# Patient Record
Sex: Female | Born: 1995 | Race: White | Hispanic: No | Marital: Single | Smoking: Former smoker
Health system: Southern US, Community
[De-identification: ages and names within clinical notes are randomized; demographics above are authoritative.]

## PROBLEM LIST (undated history)

## (undated) DIAGNOSIS — F909 Attention-deficit hyperactivity disorder, unspecified type: Secondary | ICD-10-CM

## (undated) DIAGNOSIS — F32A Depression, unspecified: Secondary | ICD-10-CM

## (undated) DIAGNOSIS — J45909 Unspecified asthma, uncomplicated: Secondary | ICD-10-CM

## (undated) DIAGNOSIS — F329 Major depressive disorder, single episode, unspecified: Secondary | ICD-10-CM

## (undated) HISTORY — PX: WISDOM TOOTH EXTRACTION: SHX21

---
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
## 2017-04-16 ENCOUNTER — Encounter (HOSPITAL_COMMUNITY): Payer: Self-pay | Admitting: Emergency Medicine

## 2017-04-16 ENCOUNTER — Emergency Department (HOSPITAL_COMMUNITY)
Admission: EM | Admit: 2017-04-16 | Discharge: 2017-04-16 | Disposition: A | Discharge status: Home / Self Care | Payer: BLUE CROSS/BLUE SHIELD | Attending: Emergency Medicine | Admitting: Emergency Medicine

## 2017-04-16 ENCOUNTER — Emergency Department (HOSPITAL_COMMUNITY): Payer: BLUE CROSS/BLUE SHIELD

## 2017-04-16 DIAGNOSIS — M549 Dorsalgia, unspecified: Secondary | ICD-10-CM | POA: Diagnosis not present

## 2017-04-16 DIAGNOSIS — Y939 Activity, unspecified: Secondary | ICD-10-CM | POA: Diagnosis not present

## 2017-04-16 DIAGNOSIS — F17210 Nicotine dependence, cigarettes, uncomplicated: Secondary | ICD-10-CM | POA: Diagnosis not present

## 2017-04-16 DIAGNOSIS — R42 Dizziness and giddiness: Secondary | ICD-10-CM | POA: Insufficient documentation

## 2017-04-16 DIAGNOSIS — Y998 Other external cause status: Secondary | ICD-10-CM | POA: Diagnosis not present

## 2017-04-16 DIAGNOSIS — Y92410 Unspecified street and highway as the place of occurrence of the external cause: Secondary | ICD-10-CM | POA: Insufficient documentation

## 2017-04-16 DIAGNOSIS — R51 Headache: Secondary | ICD-10-CM | POA: Insufficient documentation

## 2017-04-16 DIAGNOSIS — S022XXA Fracture of nasal bones, initial encounter for closed fracture: Secondary | ICD-10-CM | POA: Insufficient documentation

## 2017-04-16 HISTORY — DX: Major depressive disorder, single episode, unspecified: F32.9

## 2017-04-16 HISTORY — DX: Attention-deficit hyperactivity disorder, unspecified type: F90.9

## 2017-04-16 HISTORY — DX: Depression, unspecified: F32.A

## 2017-04-16 LAB — POC URINE PREG, ED: Preg Test, Ur: NEGATIVE

## 2017-04-16 MED ORDER — IBUPROFEN 800 MG PO TABS: 800.0000 mg | ORAL_TABLET | Freq: Three times a day (TID) | ORAL | 0 refills | Status: AC

## 2017-04-16 MED ORDER — CYCLOBENZAPRINE HCL 10 MG PO TABS: 10.0000 mg | ORAL_TABLET | Freq: Two times a day (BID) | ORAL | 0 refills | Status: AC | PRN

## 2017-04-16 MED ORDER — ACETAMINOPHEN 500 MG PO TABS: 500.0000 mg | ORAL_TABLET | Freq: Four times a day (QID) | ORAL | 0 refills | Status: DC | PRN
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
## 2017-11-18 ENCOUNTER — Other Ambulatory Visit: Payer: Self-pay

## 2017-11-18 ENCOUNTER — Emergency Department (HOSPITAL_COMMUNITY)
Admission: EM | Admit: 2017-11-18 | Discharge: 2017-11-19 | Disposition: A | Discharge status: Home / Self Care | Payer: 59 | Attending: Emergency Medicine | Admitting: Emergency Medicine

## 2017-11-18 ENCOUNTER — Encounter (HOSPITAL_COMMUNITY): Payer: Self-pay | Admitting: Emergency Medicine

## 2017-11-18 DIAGNOSIS — R45851 Suicidal ideations: Secondary | ICD-10-CM | POA: Diagnosis not present

## 2017-11-18 DIAGNOSIS — F310 Bipolar disorder, current episode hypomanic: Secondary | ICD-10-CM | POA: Diagnosis present

## 2017-11-18 DIAGNOSIS — F3010 Manic episode without psychotic symptoms, unspecified: Secondary | ICD-10-CM

## 2017-11-18 DIAGNOSIS — J45909 Unspecified asthma, uncomplicated: Secondary | ICD-10-CM | POA: Diagnosis not present

## 2017-11-18 DIAGNOSIS — F1020 Alcohol dependence, uncomplicated: Secondary | ICD-10-CM | POA: Diagnosis present

## 2017-11-18 HISTORY — DX: Unspecified asthma, uncomplicated: J45.909

## 2017-11-18 LAB — CBC
HCT: 40.7 % (ref 36.0–46.0)
Hemoglobin: 14.2 g/dL (ref 12.0–15.0)
MCH: 31.7 pg (ref 26.0–34.0)
MCHC: 34.9 g/dL (ref 30.0–36.0)
MCV: 90.8 fL (ref 78.0–100.0)
PLATELETS: 199 10*3/uL (ref 150–400)
RBC: 4.48 MIL/uL (ref 3.87–5.11)
RDW: 12.4 % (ref 11.5–15.5)
WBC: 6.4 10*3/uL (ref 4.0–10.5)

## 2017-11-18 LAB — COMPREHENSIVE METABOLIC PANEL
ALT: 18 U/L (ref 14–54)
AST: 26 U/L (ref 15–41)
Albumin: 4.5 g/dL (ref 3.5–5.0)
Alkaline Phosphatase: 57 U/L (ref 38–126)
Anion gap: 11 (ref 5–15)
BILIRUBIN TOTAL: 0.7 mg/dL (ref 0.3–1.2)
BUN: 7 mg/dL (ref 6–20)
CO2: 23 mmol/L (ref 22–32)
CREATININE: 0.54 mg/dL (ref 0.44–1.00)
Calcium: 8.8 mg/dL — ABNORMAL LOW (ref 8.9–10.3)
Chloride: 108 mmol/L (ref 101–111)
GFR calc Af Amer: >60 mL/min (ref >60)
Glucose, Bld: 96 mg/dL (ref 65–99)
POTASSIUM: 3.3 mmol/L — AB (ref 3.5–5.1)
Sodium: 142 mmol/L (ref 135–145)
TOTAL PROTEIN: 7.3 g/dL (ref 6.5–8.1)

## 2017-11-18 LAB — ACETAMINOPHEN LEVEL: Acetaminophen (Tylenol), Serum: <10 ug/mL — ABNORMAL LOW (ref 10–30)

## 2017-11-18 LAB — ETHANOL: ALCOHOL ETHYL (B): 136 mg/dL — AB (ref <10)

## 2017-11-18 LAB — I-STAT BETA HCG BLOOD, ED (MC, WL, AP ONLY)

## 2017-11-18 LAB — SALICYLATE LEVEL

## 2017-11-18 MED ORDER — ACETAMINOPHEN 325 MG PO TABS
650.0000 mg | ORAL_TABLET | ORAL | Status: DC | PRN
  Administered 2017-11-18 – 2017-11-19 (×2): 650 mg via ORAL
  Filled 2017-11-18 (×2): qty 2

## 2017-11-18 MED ORDER — BENZTROPINE MESYLATE 1 MG PO TABS: 1.0000 mg | ORAL_TABLET | Freq: Four times a day (QID) | ORAL | Status: DC | PRN

## 2017-11-18 MED ORDER — ARIPIPRAZOLE 2 MG PO TABS
2.0000 mg | ORAL_TABLET | Freq: Every day | ORAL | Status: DC
Start: 2017-11-18 — End: 2017-11-19
  Administered 2017-11-18 – 2017-11-19 (×2): 2 mg via ORAL
  Filled 2017-11-18 (×2): qty 1

## 2017-11-18 MED ORDER — GUAIFENESIN ER 600 MG PO TB12
600.0000 mg | ORAL_TABLET | Freq: Two times a day (BID) | ORAL | Status: DC | PRN
  Filled 2017-11-18 (×2): qty 1

## 2017-11-18 MED ORDER — GABAPENTIN 100 MG PO CAPS
200.0000 mg | ORAL_CAPSULE | Freq: Two times a day (BID) | ORAL | Status: DC
  Administered 2017-11-18 – 2017-11-19 (×3): 200 mg via ORAL
  Filled 2017-11-18 (×4): qty 2

## 2017-11-18 MED ORDER — HYDROXYZINE HCL 25 MG PO TABS
25.0000 mg | ORAL_TABLET | Freq: Four times a day (QID) | ORAL | Status: DC | PRN
  Administered 2017-11-18 (×2): 25 mg via ORAL
  Filled 2017-11-18 (×2): qty 1

## 2017-11-18 MED ORDER — POTASSIUM CHLORIDE CRYS ER 20 MEQ PO TBCR
40.0000 meq | EXTENDED_RELEASE_TABLET | Freq: Once | ORAL | Status: AC
  Administered 2017-11-18: 40 meq via ORAL
  Filled 2017-11-18: qty 2

## 2017-11-18 MED ORDER — HALOPERIDOL 5 MG PO TABS: 5.0000 mg | ORAL_TABLET | Freq: Four times a day (QID) | ORAL | Status: DC | PRN
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
## 2017-11-18 NOTE — ED Notes (Signed)
Pt unable to provide urine sample at this time 

## 2017-11-18 NOTE — ED Notes (Signed)
ED Provider at bedside. 

## 2017-11-18 NOTE — ED Notes (Signed)
TTS at the bedside to speak with patient.
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
## 2017-11-18 NOTE — ED Notes (Signed)
EDP called about pt's concern of nasal congestion and possible sinus infection.

## 2017-11-18 NOTE — ED Notes (Signed)
SBAR Report received from previous nurse. Pt received calm and visible on unit. Pt resting now but denies current SI/ HI, A/V H, depression, anxiety, or pain at this time, and appears otherwise stable and free of distress. Pt reminded of camera surveillance, q 15 min rounds, and rules of the milieu. Will continue to assess.
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
## 2017-11-18 NOTE — ED Notes (Signed)
Bed: W[facility]39 Expected date:  Expected time:  Means of arrival:  Comments: Hold for hall C 

## 2017-11-19 ENCOUNTER — Encounter (HOSPITAL_COMMUNITY): Payer: Self-pay | Admitting: Emergency Medicine

## 2017-11-19 ENCOUNTER — Emergency Department (HOSPITAL_COMMUNITY): Payer: 59

## 2017-11-19 DIAGNOSIS — F310 Bipolar disorder, current episode hypomanic: Secondary | ICD-10-CM | POA: Diagnosis not present

## 2017-11-19 LAB — URINALYSIS, ROUTINE W REFLEX MICROSCOPIC
BACTERIA UA: NONE SEEN
BACTERIA UA: NONE SEEN
BILIRUBIN URINE: NEGATIVE
BILIRUBIN URINE: NEGATIVE
Glucose, UA: NEGATIVE mg/dL
Glucose, UA: NEGATIVE mg/dL
Hgb urine dipstick: NEGATIVE
KETONES UR: 5 mg/dL — AB
Ketones, ur: 5 mg/dL — AB
Leukocytes, UA: NEGATIVE
NITRITE: NEGATIVE
Nitrite: NEGATIVE
PH: 6 (ref 5.0–8.0)
PH: 7 (ref 5.0–8.0)
Protein, ur: 30 mg/dL — AB
Protein, ur: NEGATIVE mg/dL
SPECIFIC GRAVITY, URINE: 1.003 — AB (ref 1.005–1.030)
Specific Gravity, Urine: 1.026 (ref 1.005–1.030)

## 2017-11-19 LAB — RAPID URINE DRUG SCREEN, HOSP PERFORMED
Amphetamines: NOT DETECTED
Barbiturates: NOT DETECTED
Benzodiazepines: NOT DETECTED
COCAINE: NOT DETECTED
OPIATES: NOT DETECTED
Tetrahydrocannabinol: POSITIVE — AB

## 2017-11-19 NOTE — ED Notes (Signed)
Pt taking a shower 

## 2017-11-19 NOTE — ED Notes (Signed)
EDP Steinl notified of pt elevated temp and pulse. Also notified of pt resulted UA
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
## 2017-11-19 NOTE — Discharge Instructions (Signed)
For your behavioral health needs, you are advised to continue treatment with [facility] [place]:       [facility]      [address]       [place], [place] [address]      [phone]

## 2017-11-19 NOTE — ED Notes (Signed)
Pt d/c home per MD order. Discharge summary reviewed with pt. Pt verbalizes understanding. Pt denies SI/HI/AVH. Personal property returned to pt. Pt signed e-signature. Ambulatory off unit. Pt mother in lobby for discharge.
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
## 2017-11-19 NOTE — [facility] Suicide Risk Assessment (Signed)
Suicide Risk Assessment  Discharge Assessment   [facility] Discharge Suicide Risk Assessment   Principal Problem: Bipolar I disorder, current or most recent episode hypomanic [facility]) Discharge Diagnoses:  Patient Active Problem List   Diagnosis Date Noted  . Bipolar I disorder, current or most recent episode hypomanic ([facility]) [F31.0] 11/18/2017  . Moderate alcohol use disorder ([facility]) [F10.20] 11/18/2017    Total Time spent with patient: 45 minutes  Musculoskeletal: Strength & Muscle Tone: within normal limits Gait & Station: normal Patient leans: N/A  Psychiatric Specialty Exam: Physical Exam ROS Blood pressure 109/67, pulse (!) 110, temperature 100.2 F (37.9 C), temperature source Oral, resp. rate 16, last menstrual period 10/23/2017, SpO2 97 %.There is no height or weight on file to calculate BMI. General Appearance: Casual Eye Contact:  Good Speech:  Clear and Coherent and Normal Rate Volume:  Normal Mood:  Anxious Affect:  Congruent Thought Process:  Coherent, Goal Directed and Linear Orientation:  Full (Time, Place, and Person) Thought Content:  Logical Suicidal Thoughts:  No Homicidal Thoughts:  No Memory:  Immediate;   Good Recent;   Good Remote;   Fair Judgement:  Poor Insight:  Lacking Psychomotor Activity:  Normal Concentration:  Concentration: Good and Attention Span: Good Recall:  Good Fund of Knowledge:  Good Language:  Good Akathisia:  No Handed:  Right AIMS (if indicated):    Assets:  Architect Housing ADL's:  Intact Cognition:  WNL   Mental Status Per Nursing Assessment::   On Admission:   agitated and manic  Demographic Factors:  Adolescent or young adult and Caucasian  Loss Factors: Financial problems/change in socioeconomic status  Historical Factors: Impulsivity  Risk Reduction Factors:   Sense of responsibility to family and Living with another person, especially a relative  Continued Clinical  Symptoms:  Severe Anxiety and/or Agitation Bipolar Disorder:   Mixed State Depression:   Aggression Comorbid alcohol abuse/dependence Impulsivity Alcohol/Substance Abuse/Dependencies  Cognitive Features That Contribute To Risk:  Closed-mindedness    Suicide Risk:  Minimal: No identifiable suicidal ideation.  Patients presenting with no risk factors but with morbid ruminations; may be classified as minimal risk based on the severity of the depressive symptoms    Plan Of Care/Follow-up recommendations:  Activity:  as tolerated Diet:  Heart Healthy  Laveda Abbe, NP 11/19/2017, 12:42 PM

## 2017-11-19 NOTE — ED Notes (Signed)
Specimen cup provided

## 2019-03-18 IMAGING — CR DG CHEST 2V
2 series · 2 of 2 positions shown · non-contrast
Comparison: None in PACs

CLINICAL DATA: Fever, altered mental status. History of asthma,
alcohol use disorder.

EXAM:
CHEST - 2 VIEW

[w chest pa]
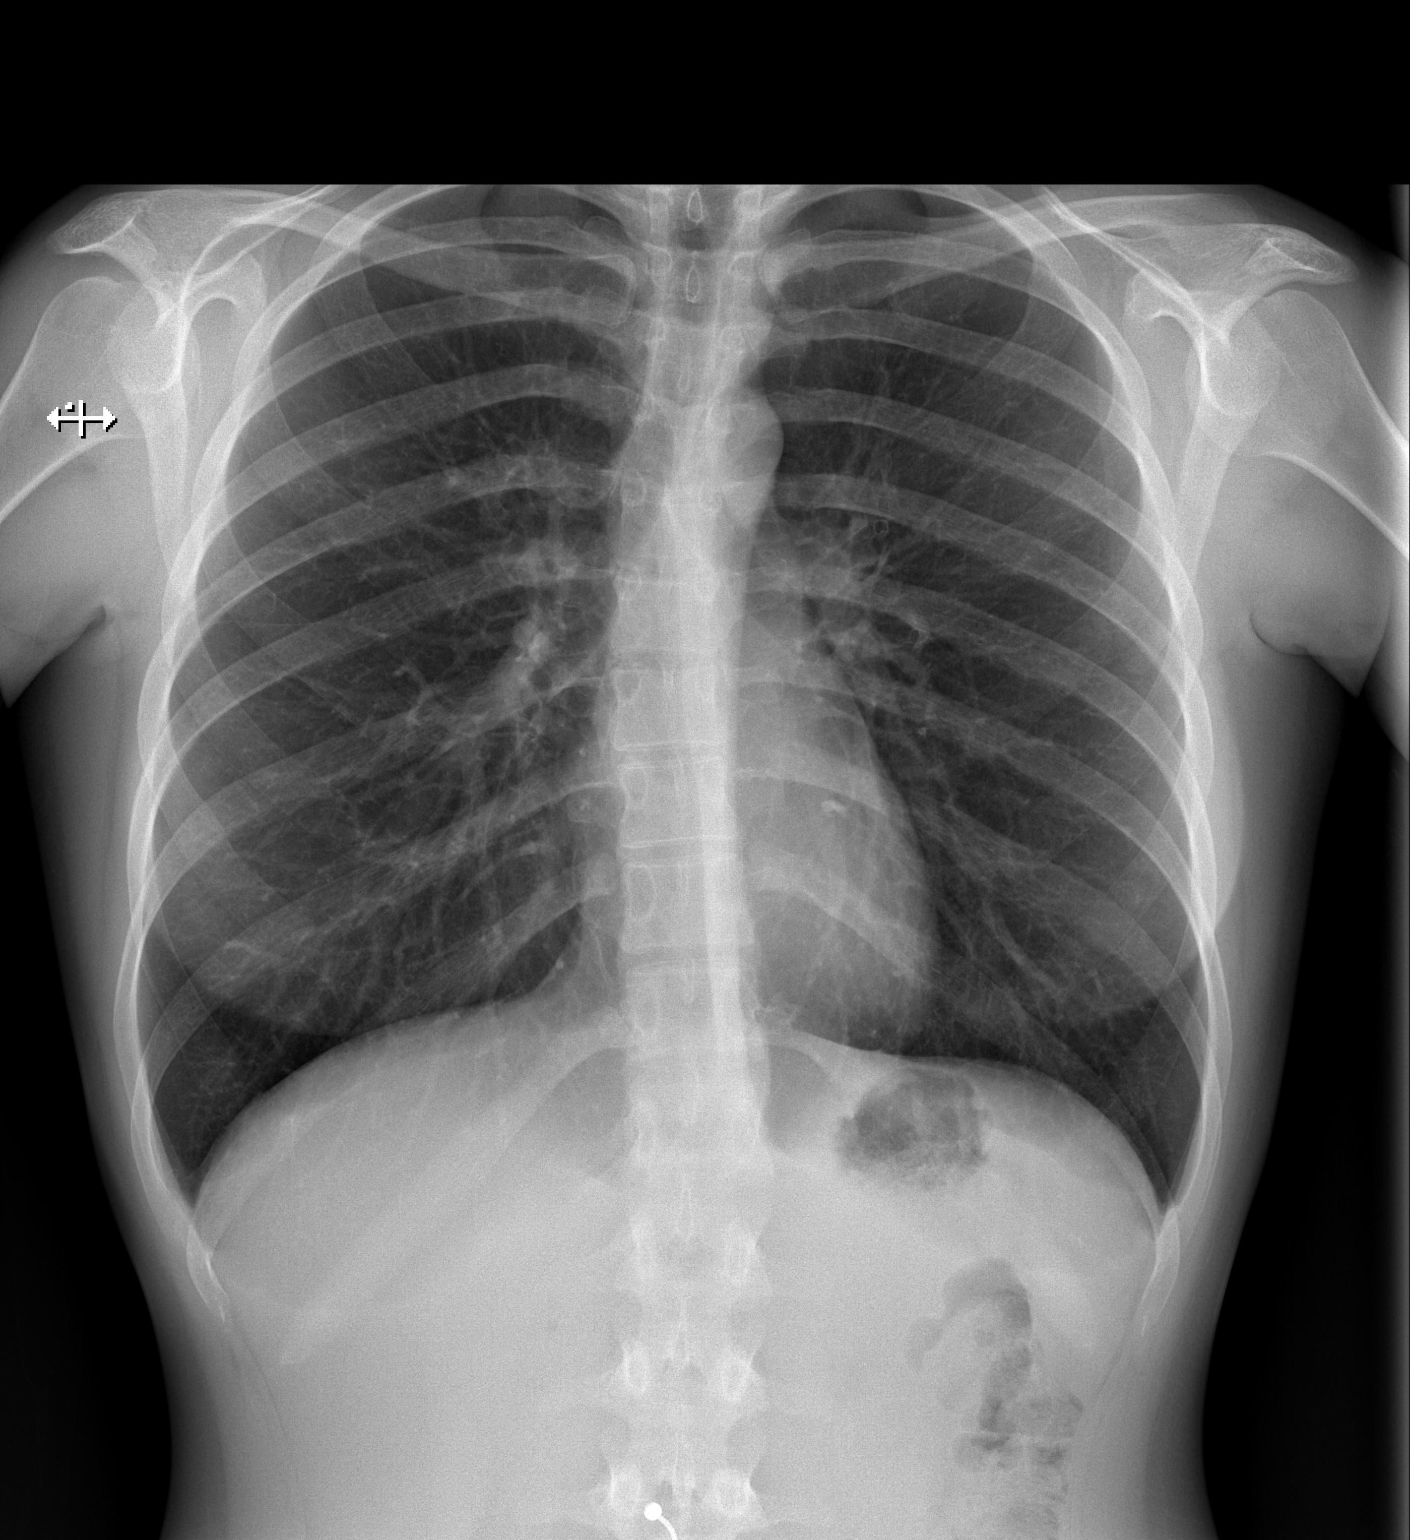

[w chest lat]
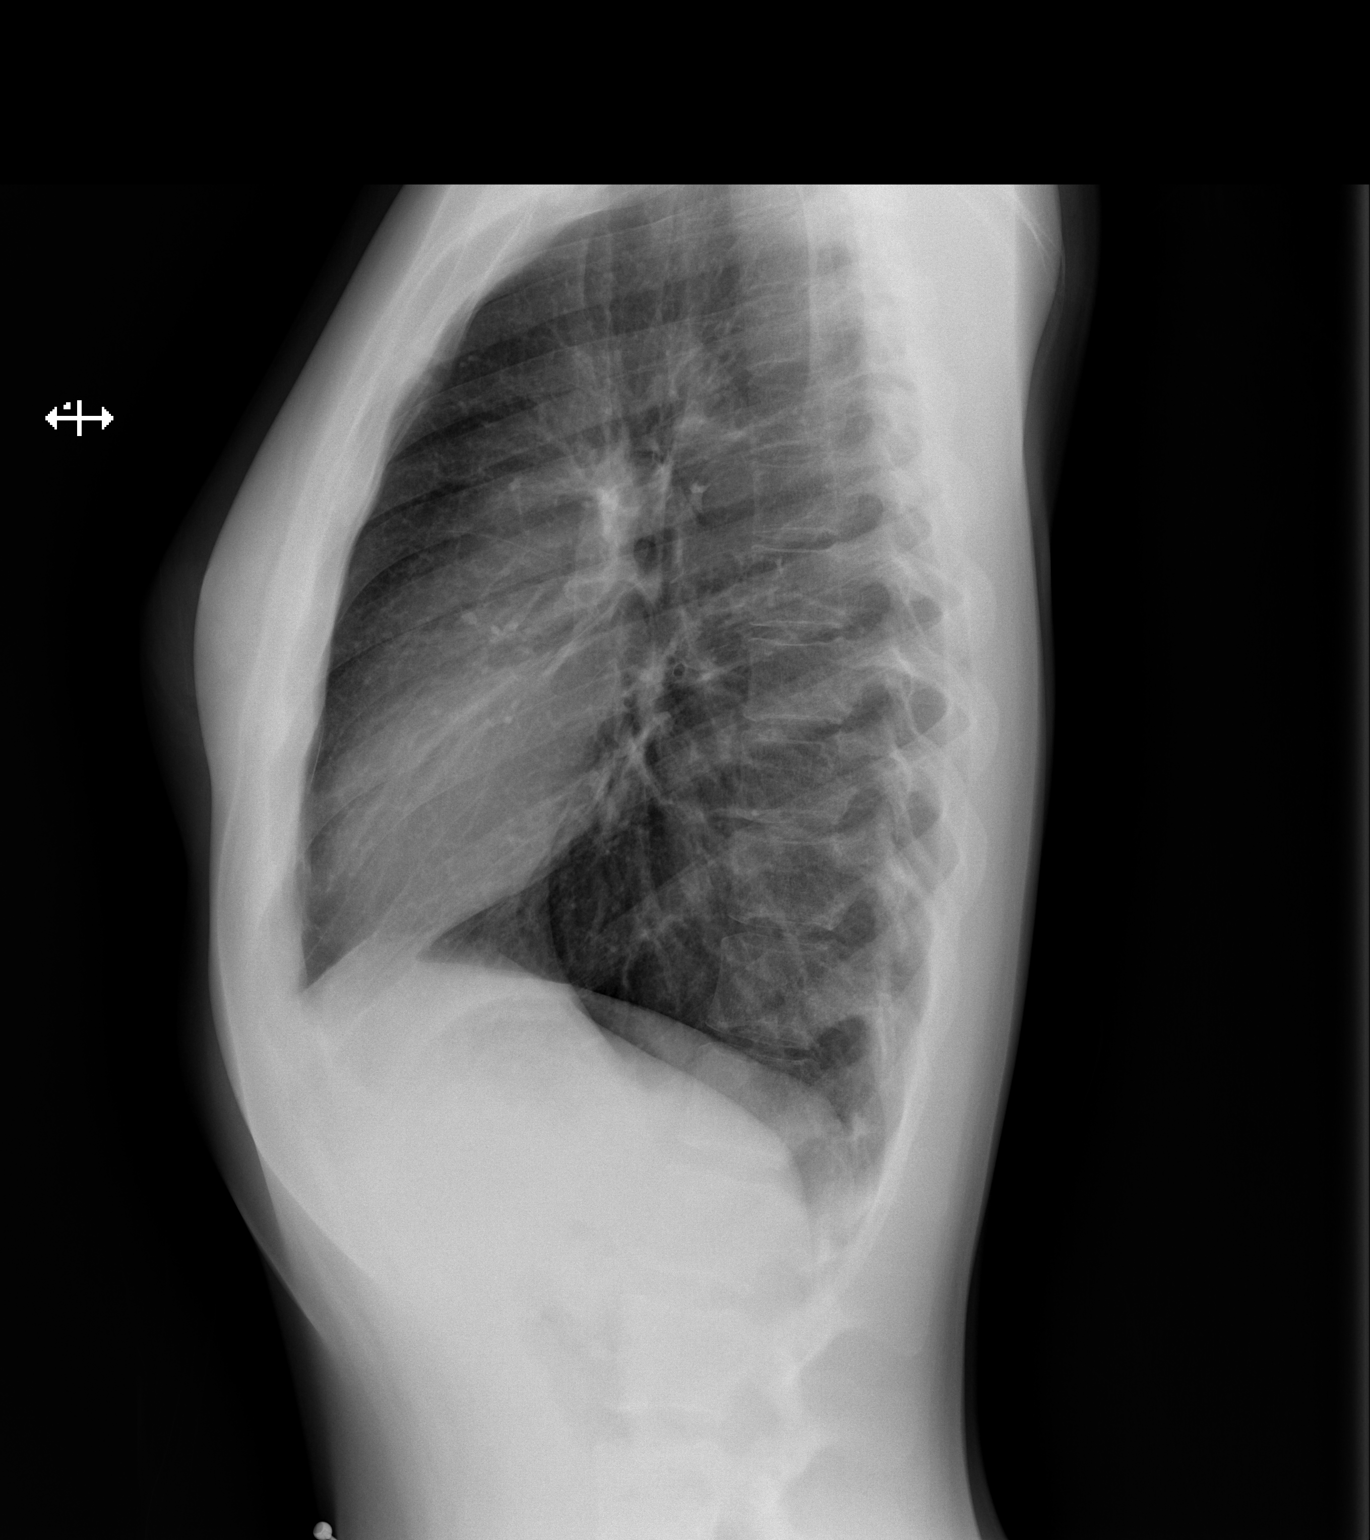

[2 of 2 positions shown; findings below may reference images not displayed]

FINDINGS: The lungs are mildly hyperinflated. There is no alveolar infiltrate
or pleural effusion. There is no pneumothorax. The heart and
pulmonary vascularity are normal. The mediastinum is normal in
width. There is gentle dextrocurvature centered in the midthoracic
spine. The bony thorax is unremarkable.
IMPRESSION: Mild hyperinflation consistent with known reactive airway disease.
No pneumonia nor other acute cardiopulmonary abnormality.
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
## 2020-10-24 ENCOUNTER — Emergency Department (HOSPITAL_BASED_OUTPATIENT_CLINIC_OR_DEPARTMENT_OTHER)
Admission: EM | Admit: 2020-10-24 | Discharge: 2020-10-24 | Disposition: A | Discharge status: Home / Self Care | Payer: No Typology Code available for payment source | Attending: Emergency Medicine | Admitting: Emergency Medicine

## 2020-10-24 ENCOUNTER — Other Ambulatory Visit: Payer: Self-pay

## 2020-10-24 ENCOUNTER — Encounter (HOSPITAL_BASED_OUTPATIENT_CLINIC_OR_DEPARTMENT_OTHER): Payer: Self-pay | Admitting: Emergency Medicine

## 2020-10-24 DIAGNOSIS — F17210 Nicotine dependence, cigarettes, uncomplicated: Secondary | ICD-10-CM | POA: Insufficient documentation

## 2020-10-24 DIAGNOSIS — O219 Vomiting of pregnancy, unspecified: Secondary | ICD-10-CM | POA: Diagnosis present

## 2020-10-24 DIAGNOSIS — E876 Hypokalemia: Secondary | ICD-10-CM | POA: Insufficient documentation

## 2020-10-24 DIAGNOSIS — Z3A01 Less than 8 weeks gestation of pregnancy: Secondary | ICD-10-CM | POA: Insufficient documentation

## 2020-10-24 DIAGNOSIS — R000 Tachycardia, unspecified: Secondary | ICD-10-CM | POA: Diagnosis not present

## 2020-10-24 DIAGNOSIS — O210 Mild hyperemesis gravidarum: Secondary | ICD-10-CM | POA: Insufficient documentation

## 2020-10-24 DIAGNOSIS — J45909 Unspecified asthma, uncomplicated: Secondary | ICD-10-CM | POA: Diagnosis not present

## 2020-10-24 LAB — COMPREHENSIVE METABOLIC PANEL
ALT: 13 U/L (ref 0–44)
AST: 18 U/L (ref 15–41)
Albumin: 4.6 g/dL (ref 3.5–5.0)
Alkaline Phosphatase: 45 U/L (ref 38–126)
Anion gap: 11 (ref 5–15)
BUN: 10 mg/dL (ref 6–20)
CO2: 21 mmol/L — ABNORMAL LOW (ref 22–32)
Calcium: 9.4 mg/dL (ref 8.9–10.3)
Chloride: 102 mmol/L (ref 98–111)
Creatinine, Ser: 0.54 mg/dL (ref 0.44–1.00)
GFR, Estimated: >60 mL/min (ref >60)
Glucose, Bld: 94 mg/dL (ref 70–99)
Potassium: 3.1 mmol/L — ABNORMAL LOW (ref 3.5–5.1)
Sodium: 134 mmol/L — ABNORMAL LOW (ref 135–145)
Total Bilirubin: 1.8 mg/dL — ABNORMAL HIGH (ref 0.3–1.2)
Total Protein: 7.8 g/dL (ref 6.5–8.1)

## 2020-10-24 LAB — CBC WITH DIFFERENTIAL/PLATELET
Abs Immature Granulocytes: 0.04 10*3/uL (ref 0.00–0.07)
Basophils Absolute: 0 10*3/uL (ref 0.0–0.1)
Basophils Relative: 0 %
Eosinophils Absolute: 0 10*3/uL (ref 0.0–0.5)
Eosinophils Relative: 0 %
HCT: 42.9 % (ref 36.0–46.0)
Hemoglobin: 15.5 g/dL — ABNORMAL HIGH (ref 12.0–15.0)
Immature Granulocytes: 0 %
Lymphocytes Relative: 15 %
Lymphs Abs: 1.4 10*3/uL (ref 0.7–4.0)
MCH: 32.8 pg (ref 26.0–34.0)
MCHC: 36.1 g/dL — ABNORMAL HIGH (ref 30.0–36.0)
MCV: 90.7 fL (ref 80.0–100.0)
Monocytes Absolute: 0.7 10*3/uL (ref 0.1–1.0)
Monocytes Relative: 8 %
Neutro Abs: 7.1 10*3/uL (ref 1.7–7.7)
Neutrophils Relative %: 77 %
Platelets: 249 10*3/uL (ref 150–400)
RBC: 4.73 MIL/uL (ref 3.87–5.11)
RDW: 11.9 % (ref 11.5–15.5)
WBC: 9.3 10*3/uL (ref 4.0–10.5)
nRBC: 0 % (ref 0.0–0.2)

## 2020-10-24 LAB — URINALYSIS, ROUTINE W REFLEX MICROSCOPIC
Bilirubin Urine: NEGATIVE
Glucose, UA: NEGATIVE mg/dL
Hgb urine dipstick: NEGATIVE
Ketones, ur: 15 mg/dL — AB
Nitrite: NEGATIVE
Protein, ur: NEGATIVE mg/dL
Specific Gravity, Urine: 1.005 (ref 1.005–1.030)
pH: 5 (ref 5.0–8.0)

## 2020-10-24 LAB — URINALYSIS, MICROSCOPIC (REFLEX): RBC / HPF: NONE SEEN RBC/hpf (ref 0–5)

## 2020-10-24 LAB — PREGNANCY, URINE: Preg Test, Ur: POSITIVE — AB

## 2020-10-24 LAB — HCG, QUANTITATIVE, PREGNANCY: hCG, Beta Chain, Quant, S: 61217 m[IU]/mL — ABNORMAL HIGH (ref <5)

## 2020-10-24 MED ORDER — METOCLOPRAMIDE HCL 10 MG PO TABS: 10.0000 mg | ORAL_TABLET | Freq: Three times a day (TID) | ORAL | 0 refills | Status: DC | PRN

## 2020-10-24 MED ORDER — METOCLOPRAMIDE HCL 5 MG/ML IJ SOLN
10.0000 mg | Freq: Once | INTRAMUSCULAR | Status: AC
  Administered 2020-10-24: 10 mg via INTRAVENOUS
  Filled 2020-10-24: qty 2

## 2020-10-24 MED ORDER — SODIUM CHLORIDE 0.9 % IV BOLUS
1000.0000 mL | Freq: Once | INTRAVENOUS | Status: AC
  Administered 2020-10-24: 1000 mL via INTRAVENOUS

## 2020-10-24 MED ORDER — METOCLOPRAMIDE HCL 10 MG PO TABS: 10.0000 mg | ORAL_TABLET | Freq: Three times a day (TID) | ORAL | 0 refills | Status: AC | PRN

## 2020-10-24 MED ORDER — DOXYLAMINE-PYRIDOXINE 10-10 MG PO TBEC: 2.0000 | DELAYED_RELEASE_TABLET | Freq: Every evening | ORAL | 1 refills | Status: AC | PRN

## 2020-10-24 MED ORDER — PROMETHAZINE HCL 25 MG/ML IJ SOLN
25.0000 mg | Freq: Once | INTRAMUSCULAR | Status: AC
  Administered 2020-10-24: 25 mg via INTRAVENOUS
  Filled 2020-10-24: qty 1

## 2020-10-24 MED ORDER — DOXYLAMINE-PYRIDOXINE 10-10 MG PO TBEC: 2.0000 | DELAYED_RELEASE_TABLET | Freq: Every evening | ORAL | 1 refills | Status: DC | PRN

## 2020-10-24 NOTE — ED Provider Notes (Signed)
[facility] EMERGENCY DEPARTMENT Provider Note   CSN: [id number] Arrival date & time: 10/24/20  0263     History Chief Complaint  Patient presents with  . Emesis During Pregnancy    Deborah Morgan is a 25 y.o. female.  She is approximately [redacted] weeks pregnant by dates.  Has not had any ultrasound confirmation.  Complaining of 4 days of vomiting.  Last few episodes have been associated with a little bit of blood in the emesis.  Burning substernal chest pain.  No abdominal pain.  No fevers chills.  No diarrhea urinary symptoms vaginal discharge or bleeding.  Has not tried anything for it.  The history is provided by the patient.  Emesis Severity:  Moderate Duration:  4 days Timing:  Intermittent Quality:  Bilious material and stomach contents Progression:  Unchanged Chronicity:  New Recent urination:  Decreased Relieved by:  None tried Worsened by:  Nothing Ineffective treatments:  None tried Associated symptoms: no abdominal pain, no cough, no diarrhea, no fever, no headaches, no myalgias and no sore throat   Risk factors: pregnant        Past Medical History:  Diagnosis Date  . ADHD   . Asthma   . Depression     Patient Active Problem List   Diagnosis Date Noted  . Bipolar I disorder, current or most recent episode hypomanic (HCC) 11/18/2017  . Moderate alcohol use disorder (HCC) 11/18/2017    Past Surgical History:  Procedure Laterality Date  . WISDOM TOOTH EXTRACTION       OB History    Gravida  1   Para  0   Term  0   Preterm  0   AB  0   Living        SAB  0   IAB  0   Ectopic  0   Multiple      Live Births              No family history on file.  Social History   Tobacco Use  . Smoking status: Current Every Day Smoker    Packs/day: 0.50    Years: 5.00    Pack years: 2.50    Types: Cigarettes  . Smokeless tobacco: Never Used  Vaping Use  . Vaping Use: Some days  Substance Use Topics  . Alcohol use: Yes     Comment: Pt endorsed use of 4 loco on 11/17/17  . Drug use: Yes    Types: Marijuana    Home Medications Prior to Admission medications   Medication Sig Start Date End Date Taking? Authorizing Provider  doxylamine, Sleep, (UNISOM) 25 MG tablet Take 25 mg by mouth at bedtime as needed.   Yes [provider]  acetaminophen (TYLENOL) 500 MG tablet Take 1 tablet (500 mg total) by mouth every 6 (six) hours as needed. 04/16/17   Law, Waylan Boga, PA-C  albuterol (VENTOLIN HFA) 108 (90 Base) MCG/ACT inhaler Inhale 2 puffs into the lungs every 6 (six) hours as needed. 05/29/20   [provider]  cyclobenzaprine (FLEXERIL) 10 MG tablet Take 1 tablet (10 mg total) by mouth 2 (two) times daily as needed for muscle spasms. 04/16/17   Law, Waylan Boga, PA-C  hydrOXYzine (ATARAX/VISTARIL) 10 MG tablet Take by mouth.    [provider]  ibuprofen (ADVIL,MOTRIN) 200 MG tablet Take 400 mg by mouth every 6 (six) hours as needed for mild pain.    [provider]  ibuprofen (ADVIL,MOTRIN) 800  MG tablet Take 1 tablet (800 mg total) by mouth 3 (three) times daily. 04/16/17   Law, Waylan Boga, PA-C  lamoTRIgine (LAMICTAL) 25 MG tablet Take 50 mg by mouth daily.    [provider]  Melatonin 10 MG TABS Take by mouth.    [provider]  sertraline (ZOLOFT) 50 MG tablet Take 50 mg by mouth daily.    [provider]    Allergies    Hydrocodone and Codeine  Review of Systems   Review of Systems  Constitutional: Negative for fever.  HENT: Negative for sore throat.   Eyes: Negative for visual disturbance.  Respiratory: Negative for cough and shortness of breath.   Cardiovascular: Positive for chest pain.  Gastrointestinal: Positive for vomiting. Negative for abdominal pain and diarrhea.  Genitourinary: Negative for dysuria.  Musculoskeletal: Negative for myalgias.  Skin: Negative for rash.  Neurological: Negative for headaches.    Physical  Exam Updated Vital Signs BP 119/74 (BP Location: Right Arm)   Pulse (!) 116   Temp 98.8 F (37.1 C) (Oral)   Resp 20   Ht 5\' 2"  (1.575 m)   Wt 60.8 kg   LMP 09/08/2020 (Exact Date)   SpO2 100%   BMI 24.51 kg/m   Physical Exam Vitals and nursing note reviewed.  Constitutional:      General: She is not in acute distress.    Appearance: Normal appearance. She is well-developed and well-nourished.  HENT:     Head: Normocephalic and atraumatic.  Eyes:     Conjunctiva/sclera: Conjunctivae normal.  Cardiovascular:     Rate and Rhythm: Regular rhythm. Tachycardia present.     Heart sounds: No murmur heard.   Pulmonary:     Effort: Pulmonary effort is normal. No respiratory distress.     Breath sounds: Normal breath sounds.  Abdominal:     Palpations: Abdomen is soft.     Tenderness: There is no abdominal tenderness.  Musculoskeletal:        General: No edema.     Cervical back: Neck supple.  Skin:    General: Skin is warm and dry.     Capillary Refill: Capillary refill takes less than 2 seconds.  Neurological:     General: No focal deficit present.     Mental Status: She is alert.  Psychiatric:        Mood and Affect: Mood and affect normal.     ED Results / Procedures / Treatments   Labs (all labs ordered are listed, but only abnormal results are displayed) Labs Reviewed  URINALYSIS, ROUTINE W REFLEX MICROSCOPIC - Abnormal; Notable for the following components:      Result Value   APPearance CLOUDY (*)    Ketones, ur 15 (*)    Leukocytes,Ua MODERATE (*)    All other components within normal limits  PREGNANCY, URINE - Abnormal; Notable for the following components:   Preg Test, Ur POSITIVE (*)    All other components within normal limits  COMPREHENSIVE METABOLIC PANEL - Abnormal; Notable for the following components:   Sodium 134 (*)    Potassium 3.1 (*)    CO2 21 (*)    Total Bilirubin 1.8 (*)    All other components within normal limits  CBC WITH  DIFFERENTIAL/PLATELET - Abnormal; Notable for the following components:   Hemoglobin 15.5 (*)    MCHC 36.1 (*)    All other components within normal limits  HCG, QUANTITATIVE, PREGNANCY - Abnormal; Notable for the following components:  hCG, Beta Chain, Quant, S 61,217 (*)    All other components within normal limits  URINALYSIS, MICROSCOPIC (REFLEX) - Abnormal; Notable for the following components:   Bacteria, UA FEW (*)    All other components within normal limits    EKG None  Radiology No results found.  Procedures Procedures   Medications Ordered in ED Medications  sodium chloride 0.9 % bolus 1,000 mL (0 mLs Intravenous Stopped 10/24/20 0903)  promethazine (PHENERGAN) injection 25 mg (25 mg Intravenous Given 10/24/20 0753)  sodium chloride 0.9 % bolus 1,000 mL (0 mLs Intravenous Stopped 10/24/20 1053)  metoCLOPramide (REGLAN) injection 10 mg (10 mg Intravenous Given 10/24/20 0919)    ED Course  I have reviewed the triage vital signs and the nursing notes.  Pertinent labs & imaging results that were available during my care of the patient were reviewed by me and considered in my medical decision making (see chart for details).  Clinical Course as of 10/24/20 1721  Sun Oct 24, 2020  1022 Reassessed patient, she vomited once after the Compazine and is doing better after the Reglan.  Has tolerated some sips.  Lab work shows mildly low potassium but otherwise fairly unremarkable.  She currently is not on any treatment for morning sickness. [MB]    Clinical Course User Index [MB] Terrilee Files, MD   MDM Rules/Calculators/A&P                         This patient complains of nausea and vomiting, some blood in the vomitus, early pregnancy; this involves an extensive number of treatment Options and is a complaint that carries with it a high risk of complications and Morbidity. The differential includes hyperemesis, metabolic derangement, Mallory-Weiss, gastritis, peptic ulcer  disease  I ordered, reviewed and interpreted labs, which included CBC with normal white count normal hemoglobin, chemistries with mildly low sodium and low bicarb reflecting some dehydration, urinalysis not convincingly infected, beta-hCG appropriate for dates I ordered medication IV fluids Compazine Reglan Additional history obtained from patient significant other and mother Previous records obtained and reviewed in epic, no recent admissions  After the interventions stated above, I reevaluated the patient and found patient's tachycardia to be resolved and is tolerating p.o.  She is comfortable plan for discharge with antiemetics.  Recommended close follow-up with OB/GYN.  Return instructions discussed   Final Clinical Impression(s) / ED Diagnoses Final diagnoses:  Hyperemesis gravidarum  Hypokalemia    Rx / DC Orders ED Discharge Orders         Ordered    Doxylamine-Pyridoxine 10-10 MG TBEC  At bedtime PRN,   Status:  Discontinued        10/24/20 1035    metoCLOPramide (REGLAN) 10 MG tablet  Every 8 hours PRN,   Status:  Discontinued        10/24/20 1035    Doxylamine-Pyridoxine 10-10 MG TBEC  At bedtime PRN        10/24/20 1118    metoCLOPramide (REGLAN) 10 MG tablet  Every 8 hours PRN        10/24/20 1118           Terrilee Files, MD 10/24/20 1723

## 2020-10-24 NOTE — ED Notes (Signed)
Pt ambulatory to bathroom

## 2020-10-24 NOTE — ED Notes (Signed)
Pt reports continues to have nausea and vomited about 5 minutes prior to this nurse coming in the room.

## 2020-10-24 NOTE — ED Notes (Signed)
Pt reports nausea decreased.

## 2020-10-24 NOTE — Discharge Instructions (Addendum)
You were seen in the emergency department for nausea and vomiting during early pregnancy.  Your lab work showed your potassium to be mildly low but otherwise was unremarkable.  We are prescribing you some medication that may help your symptoms.  Please do not take your Unisom/doxylamine while you are using this medication.  Contact your OB tomorrow and let them know about the medications you are on.  Return to the emergency department if any worsening or concerning symptoms

## 2020-10-24 NOTE — ED Triage Notes (Signed)
LMP 1/5, reports vomiting for four days. Reports "feeling dehydrated." States some blood in her vomit last night.

## 2020-11-23 DIAGNOSIS — O3680X0 Pregnancy with inconclusive fetal viability, not applicable or unspecified: Secondary | ICD-10-CM | POA: Diagnosis not present

## 2020-11-23 DIAGNOSIS — O0991 Supervision of high risk pregnancy, unspecified, first trimester: Secondary | ICD-10-CM | POA: Diagnosis not present

## 2021-02-01 DIAGNOSIS — O99321 Drug use complicating pregnancy, first trimester: Secondary | ICD-10-CM | POA: Diagnosis not present

## 2021-02-01 DIAGNOSIS — O0991 Supervision of high risk pregnancy, unspecified, first trimester: Secondary | ICD-10-CM | POA: Diagnosis not present

## 2021-02-01 DIAGNOSIS — O0992 Supervision of high risk pregnancy, unspecified, second trimester: Secondary | ICD-10-CM | POA: Diagnosis not present

## 2021-03-03 DIAGNOSIS — Z3689 Encounter for other specified antenatal screening: Secondary | ICD-10-CM | POA: Diagnosis not present

## 2021-03-03 DIAGNOSIS — O99321 Drug use complicating pregnancy, first trimester: Secondary | ICD-10-CM | POA: Diagnosis not present

## 2021-03-03 DIAGNOSIS — O0992 Supervision of high risk pregnancy, unspecified, second trimester: Secondary | ICD-10-CM | POA: Diagnosis not present

## 2021-03-16 ENCOUNTER — Other Ambulatory Visit: Payer: Self-pay

## 2021-03-16 ENCOUNTER — Emergency Department (HOSPITAL_BASED_OUTPATIENT_CLINIC_OR_DEPARTMENT_OTHER)
Admission: EM | Admit: 2021-03-16 | Discharge: 2021-03-16 | Disposition: A | Discharge status: Home / Self Care | Payer: No Typology Code available for payment source | Attending: Emergency Medicine | Admitting: Emergency Medicine

## 2021-03-16 ENCOUNTER — Encounter (HOSPITAL_BASED_OUTPATIENT_CLINIC_OR_DEPARTMENT_OTHER): Payer: Self-pay | Admitting: Emergency Medicine

## 2021-03-16 DIAGNOSIS — Z3A27 27 weeks gestation of pregnancy: Secondary | ICD-10-CM | POA: Insufficient documentation

## 2021-03-16 DIAGNOSIS — O99613 Diseases of the digestive system complicating pregnancy, third trimester: Secondary | ICD-10-CM | POA: Diagnosis not present

## 2021-03-16 DIAGNOSIS — O99333 Smoking (tobacco) complicating pregnancy, third trimester: Secondary | ICD-10-CM | POA: Insufficient documentation

## 2021-03-16 DIAGNOSIS — Z20822 Contact with and (suspected) exposure to covid-19: Secondary | ICD-10-CM | POA: Diagnosis not present

## 2021-03-16 DIAGNOSIS — K529 Noninfective gastroenteritis and colitis, unspecified: Secondary | ICD-10-CM | POA: Diagnosis not present

## 2021-03-16 DIAGNOSIS — F17210 Nicotine dependence, cigarettes, uncomplicated: Secondary | ICD-10-CM | POA: Insufficient documentation

## 2021-03-16 DIAGNOSIS — J45909 Unspecified asthma, uncomplicated: Secondary | ICD-10-CM | POA: Insufficient documentation

## 2021-03-16 LAB — CBC WITH DIFFERENTIAL/PLATELET
Abs Immature Granulocytes: 0.13 10*3/uL — ABNORMAL HIGH (ref 0.00–0.07)
Basophils Absolute: 0 10*3/uL (ref 0.0–0.1)
Basophils Relative: 0 %
Eosinophils Absolute: 0 10*3/uL (ref 0.0–0.5)
Eosinophils Relative: 0 %
HCT: 36 % (ref 36.0–46.0)
Hemoglobin: 13 g/dL (ref 12.0–15.0)
Immature Granulocytes: 1 %
Lymphocytes Relative: 9 %
Lymphs Abs: 1.4 10*3/uL (ref 0.7–4.0)
MCH: 34 pg (ref 26.0–34.0)
MCHC: 36.1 g/dL — ABNORMAL HIGH (ref 30.0–36.0)
MCV: 94.2 fL (ref 80.0–100.0)
Monocytes Absolute: 0.7 10*3/uL (ref 0.1–1.0)
Monocytes Relative: 5 %
Neutro Abs: 13 10*3/uL — ABNORMAL HIGH (ref 1.7–7.7)
Neutrophils Relative %: 85 %
Platelets: 217 10*3/uL (ref 150–400)
RBC: 3.82 MIL/uL — ABNORMAL LOW (ref 3.87–5.11)
RDW: 12.7 % (ref 11.5–15.5)
WBC: 15.3 10*3/uL — ABNORMAL HIGH (ref 4.0–10.5)
nRBC: 0 % (ref 0.0–0.2)

## 2021-03-16 LAB — COMPREHENSIVE METABOLIC PANEL
ALT: 21 U/L (ref 0–44)
AST: 22 U/L (ref 15–41)
Albumin: 3.3 g/dL — ABNORMAL LOW (ref 3.5–5.0)
Alkaline Phosphatase: 68 U/L (ref 38–126)
Anion gap: 9 (ref 5–15)
BUN: 7 mg/dL (ref 6–20)
CO2: 24 mmol/L (ref 22–32)
Calcium: 8.9 mg/dL (ref 8.9–10.3)
Chloride: 100 mmol/L (ref 98–111)
Creatinine, Ser: 0.46 mg/dL (ref 0.44–1.00)
GFR, Estimated: >60 mL/min (ref >60)
Glucose, Bld: 104 mg/dL — ABNORMAL HIGH (ref 70–99)
Potassium: 3.7 mmol/L (ref 3.5–5.1)
Sodium: 133 mmol/L — ABNORMAL LOW (ref 135–145)
Total Bilirubin: 0.2 mg/dL — ABNORMAL LOW (ref 0.3–1.2)
Total Protein: 7 g/dL (ref 6.5–8.1)

## 2021-03-16 LAB — URINALYSIS, MICROSCOPIC (REFLEX): Squamous Epithelial / HPF: >50 (ref 0–5)

## 2021-03-16 LAB — URINALYSIS, ROUTINE W REFLEX MICROSCOPIC
Bilirubin Urine: NEGATIVE
Glucose, UA: NEGATIVE mg/dL
Hgb urine dipstick: NEGATIVE
Ketones, ur: 15 mg/dL — AB
Nitrite: NEGATIVE
Protein, ur: NEGATIVE mg/dL
Specific Gravity, Urine: 1.01 (ref 1.005–1.030)
pH: 8 (ref 5.0–8.0)

## 2021-03-16 LAB — LIPASE, BLOOD: Lipase: 23 U/L (ref 11–51)

## 2021-03-16 MED ORDER — ONDANSETRON HCL 4 MG PO TABS: 4.0000 mg | ORAL_TABLET | Freq: Four times a day (QID) | ORAL | 0 refills | Status: DC

## 2021-03-16 MED ORDER — METOCLOPRAMIDE HCL 5 MG/ML IJ SOLN: 10.0000 mg | Freq: Once | INTRAMUSCULAR | Status: DC

## 2021-03-16 MED ORDER — LACTATED RINGERS IV BOLUS
1000.0000 mL | Freq: Once | INTRAVENOUS | Status: AC
  Administered 2021-03-16: 1000 mL via INTRAVENOUS

## 2021-03-16 MED ORDER — ONDANSETRON 4 MG PO TBDP
8.0000 mg | ORAL_TABLET | Freq: Once | ORAL | Status: AC
  Administered 2021-03-16: 8 mg via ORAL
  Filled 2021-03-16: qty 2

## 2021-03-16 MED ORDER — SODIUM CHLORIDE 0.9 % IV SOLN: 12.5000 mg | Freq: Four times a day (QID) | INTRAVENOUS | Status: DC | PRN

## 2021-03-16 NOTE — Progress Notes (Signed)
NST reactive for [redacted] week gestation.  Cleared by OB Service.  Talked with [facility] RN and updated her with POC.  Will take patient off monitors.  Call OB office in [place] and update them.

## 2021-03-16 NOTE — ED Notes (Addendum)
Per Carollee Herter at [facility], pt can be removed from fetal monitor, fetus is checking ok, showing some mild irritability, however could be r/t mother with N/V/D. Recommends to instruct to f/u with PCP. Will update EDP [place], [place]

## 2021-03-16 NOTE — Progress Notes (Signed)
G2P0 at 27 weeks reports to [facility] with c/o abdominal pain since 7am.  Has had N/V/D since then as well.  Monitors applied per staff at ED.  Patient receives [facility] in [place].

## 2021-03-16 NOTE — Discharge Instructions (Addendum)
You are seen today in the ED for vomiting, diarrhea, belly pain.  Your work-up today was very reassuring and do not show signs of an infection.  Your baby is in good health.  Please take Zofran or Reglan as needed for vomiting.  You can take it up to every 6 hours as needed.  If condition change or worsen please return back to the ED for further evaluation.

## 2021-03-16 NOTE — ED Notes (Signed)
ED Provider at bedside. 

## 2021-03-16 NOTE — ED Provider Notes (Signed)
[facility] EMERGENCY DEPARTMENT Provider Note   CSN: [id number] Arrival date & time: 03/16/21  1231     History Chief Complaint  Patient presents with   Abdominal Pain    [redacted] weeks Pregnant    Deborah Morgan is a 25 y.o. female.  HPI  Patient is a 27-week G1 P0 presenting with crampy abdominal pain since 7 AM this morning.  Pain started acutely, was initially sharp, has been constant but became dull about 20 minutes ago.  The pain is worse on the right side and it radiates to the back.  There is associated nausea and several bouts of emesis.  She is also had 2 episodes of diarrhea.  She has not had fevers at home, but her oral temperature is 99.9 here in the ED.  She denies any dysuria, hematuria, vaginal bleeding.  No history of kidney stones.  She drink socially before being pregnant, but denies any heavy alcohol use or cigarette use.  Past Medical History:  Diagnosis Date   ADHD    Asthma    Depression     Patient Active Problem List   Diagnosis Date Noted   Bipolar I disorder, current or most recent episode hypomanic (HCC) 11/18/2017   Moderate alcohol use disorder (HCC) 11/18/2017    Past Surgical History:  Procedure Laterality Date   WISDOM TOOTH EXTRACTION       OB History     Gravida  2   Para  0   Term  0   Preterm  0   AB  0   Living         SAB  0   IAB  0   Ectopic  0   Multiple      Live Births              History reviewed. No pertinent family history.  Social History   Tobacco Use   Smoking status: Every Day    Packs/day: 0.50    Years: 5.00    Pack years: 2.50    Types: Cigarettes   Smokeless tobacco: Never  Vaping Use   Vaping Use: Some days  Substance Use Topics   Alcohol use: Not Currently    Comment: Pt endorsed use of 4 loco on 11/17/17   Drug use: Not Currently    Types: Marijuana    Home Medications Prior to Admission medications   Medication Sig Start Date End Date Taking? Authorizing  Provider  acetaminophen (TYLENOL) 500 MG tablet Take 1 tablet (500 mg total) by mouth every 6 (six) hours as needed. 04/16/17   Law, Waylan Boga, PA-C  albuterol (VENTOLIN HFA) 108 (90 Base) MCG/ACT inhaler Inhale 2 puffs into the lungs every 6 (six) hours as needed. 05/29/20   [provider]  cyclobenzaprine (FLEXERIL) 10 MG tablet Take 1 tablet (10 mg total) by mouth 2 (two) times daily as needed for muscle spasms. 04/16/17   Law, Waylan Boga, PA-C  doxylamine, Sleep, (UNISOM) 25 MG tablet Take 25 mg by mouth at bedtime as needed.    [provider]  Doxylamine-Pyridoxine 10-10 MG TBEC Take 2 tablets by mouth at bedtime as needed. 10/24/20   Terrilee Files, MD  hydrOXYzine (ATARAX/VISTARIL) 10 MG tablet Take by mouth.    [provider]  ibuprofen (ADVIL,MOTRIN) 200 MG tablet Take 400 mg by mouth every 6 (six) hours as needed for mild pain.    [provider]  ibuprofen (ADVIL,MOTRIN) 800 MG tablet Take  1 tablet (800 mg total) by mouth 3 (three) times daily. 04/16/17   Law, Waylan Boga, PA-C  lamoTRIgine (LAMICTAL) 25 MG tablet Take 50 mg by mouth daily.    [provider]  Melatonin 10 MG TABS Take by mouth.    [provider]  metoCLOPramide (REGLAN) 10 MG tablet Take 1 tablet (10 mg total) by mouth every 8 (eight) hours as needed for nausea or vomiting. 10/24/20   Terrilee Files, MD  sertraline (ZOLOFT) 50 MG tablet Take 50 mg by mouth daily.    [provider]    Allergies    Hydrocodone and Codeine  Review of Systems   Review of Systems  Constitutional:  Negative for fever.  Gastrointestinal:  Positive for abdominal pain, diarrhea, nausea and vomiting.  Genitourinary:  Negative for dysuria, enuresis, vaginal bleeding and vaginal discharge.   Physical Exam Updated Vital Signs BP 117/85   Pulse 93   Temp 99.9 F (37.7 C)   Resp 17   Ht 5\' 3"  (1.6 m)   Wt 77.6 kg   LMP 09/08/2020   SpO2 100%   BMI 30.29 kg/m    Physical Exam Vitals and nursing note reviewed. Exam conducted with a chaperone present.  Constitutional:      Appearance: Normal appearance.  HENT:     Head: Normocephalic and atraumatic.  Eyes:     General: No scleral icterus.       Right eye: No discharge.        Left eye: No discharge.     Extraocular Movements: Extraocular movements intact.     Pupils: Pupils are equal, round, and reactive to light.  Cardiovascular:     Rate and Rhythm: Normal rate and regular rhythm.     Pulses: Normal pulses.     Heart sounds: Normal heart sounds. No murmur heard.   No friction rub. No gallop.  Pulmonary:     Effort: Pulmonary effort is normal. No respiratory distress.     Breath sounds: Normal breath sounds.  Abdominal:     General: Abdomen is flat. Bowel sounds are normal. There is no distension.     Palpations: Abdomen is soft.     Tenderness: There is abdominal tenderness in the right upper quadrant, epigastric area and left upper quadrant. There is no guarding or rebound. Negative signs include Murphy's sign.     Comments: Pregnant abdomen.  Some tenderness to palpation to the upper quadrants.  There is no rebound or guarding.  No rigidity.  Fetal heart sounds are appropriate.  Skin:    General: Skin is warm and dry.     Coloration: Skin is not jaundiced.  Neurological:     Mental Status: She is alert. Mental status is at baseline.     Coordination: Coordination normal.    ED Results / Procedures / Treatments   Labs (all labs ordered are listed, but only abnormal results are displayed) Labs Reviewed  CBC WITH DIFFERENTIAL/PLATELET - Abnormal; Notable for the following components:      Result Value   WBC 15.3 (*)    RBC 3.82 (*)    MCHC 36.1 (*)    Neutro Abs 13.0 (*)    Abs Immature Granulocytes 0.13 (*)    All other components within normal limits  COMPREHENSIVE METABOLIC PANEL - Abnormal; Notable for the following components:   Sodium 133 (*)    Glucose, Bld 104 (*)     Albumin 3.3 (*)    Total Bilirubin 0.2 (*)  All other components within normal limits  URINALYSIS, ROUTINE W REFLEX MICROSCOPIC - Abnormal; Notable for the following components:   APPearance CLOUDY (*)    Ketones, ur 15 (*)    Leukocytes,Ua MODERATE (*)    All other components within normal limits  URINALYSIS, MICROSCOPIC (REFLEX) - Abnormal; Notable for the following components:   Bacteria, UA MANY (*)    All other components within normal limits  SARS CORONAVIRUS 2 (TAT 6-24 HRS)  LIPASE, BLOOD    EKG None  Radiology No results found.  Procedures Procedures   Medications Ordered in ED Medications  metoCLOPramide (REGLAN) injection 10 mg (0 mg Intravenous Hold 03/16/21 1413)  lactated ringers bolus 1,000 mL (0 mLs Intravenous Stopped 03/16/21 1404)  ondansetron (ZOFRAN-ODT) disintegrating tablet 8 mg (8 mg Oral Given 03/16/21 1335)    ED Course  I have reviewed the triage vital signs and the nursing notes.  Pertinent labs & imaging results that were available during my care of the patient were reviewed by me and considered in my medical decision making (see chart for details).  Clinical Course as of 03/16/21 1508  Wed Mar 16, 2021  1416 Lipase: 23 I doubt pancreatitis given lipase is not significantly elevated.  There is abdominal pain in the also is somewhat associated with the back nausea and vomiting, but I do not believe this is due to pancreatitis.  Additionally she does not have risk factors.  Given she is [redacted] weeks pregnant, I do not think the benefits of the CT would outweigh the potential risk to the fetus. [HS]  1416 CBC with Differential(!) White count is mildly elevated but not overly concerning so.  She is not febrile, I suspect this is likely related to a gastroenteritis. [HS]    Clinical Course User Index [HS] Theron Arista, PA-C   MDM Rules/Calculators/A&P                          Her vitals are stable.  She is almost febrile at 99.9 with an oral temp.   Her nausea and vomiting have improved while here in the ED.  Combination nausea, vomiting, diarrhea for 5 hours is suspicious for a gastroenteritis.  Her work-up has been reassuring.  Please see ED course for dictation of some of the results.  I do not have concern for fetal demise at this point.  There is no vaginal bleeding.  The fetal heart sounds are appropriate.  She was seen by telemetry OB.  I spoke with Dr. Debroah Loop at [facility] who was comfortable with [place] work-up the patient for her current symptoms.  Based on the work-up thus far, the patient has some gastroenteritis.  I have advised her to take Zofran or Reglan as needed.  Advised her to keep taking fluids and.  Discussed waiting for the COVID results, but patient states she would rather go home and wait to see if she has a phone call telling her she is positive.  We discussed reasons to return back to the ED for further evaluation.  Patient voices understanding and agreement with the plan.  Final Clinical Impression(s) / ED Diagnoses Final diagnoses:  Gastroenteritis    Rx / DC Orders ED Discharge Orders     None        Theron Arista, Cordelia Poche 03/16/21 1508    Pollyann Savoy, MD 03/17/21 1311

## 2021-03-16 NOTE — ED Notes (Signed)
Pt aware urine specimen ordered. Pt reports inability to provide specimen at this time. Specimen collection device provided to patient. Pt is receiving IV fluids, endorses should be able to provide after fluids.

## 2021-03-16 NOTE — ED Triage Notes (Signed)
Pt arrives pov, c/o upper abdominal cramping and N/V/D x 5 hours. Pt endorses [redacted] weeks pregnant, endorses fetal movement. Pt denies vaginal d/c or bleeding. 1st pregancy. [facility] [place], [place], [place] 06/15/2021, LMP 09/08/20

## 2021-03-16 NOTE — ED Notes (Signed)
Deborah Morgan at [facility] with patient info as noted in chart

## 2021-03-17 LAB — SARS CORONAVIRUS 2 (TAT 6-24 HRS): SARS Coronavirus 2: NEGATIVE

## 2021-04-05 DIAGNOSIS — O0991 Supervision of high risk pregnancy, unspecified, first trimester: Secondary | ICD-10-CM | POA: Diagnosis not present

## 2021-05-10 DIAGNOSIS — O0993 Supervision of high risk pregnancy, unspecified, third trimester: Secondary | ICD-10-CM | POA: Diagnosis not present

## 2021-05-10 DIAGNOSIS — Z23 Encounter for immunization: Secondary | ICD-10-CM | POA: Diagnosis not present

## 2021-05-10 DIAGNOSIS — O99323 Drug use complicating pregnancy, third trimester: Secondary | ICD-10-CM | POA: Diagnosis not present

## 2021-05-18 DIAGNOSIS — Z3689 Encounter for other specified antenatal screening: Secondary | ICD-10-CM | POA: Diagnosis not present

## 2021-05-18 DIAGNOSIS — O0993 Supervision of high risk pregnancy, unspecified, third trimester: Secondary | ICD-10-CM | POA: Diagnosis not present

## 2021-05-24 DIAGNOSIS — O42013 Preterm premature rupture of membranes, onset of labor within 24 hours of rupture, third trimester: Secondary | ICD-10-CM | POA: Diagnosis not present

## 2021-05-24 DIAGNOSIS — O0993 Supervision of high risk pregnancy, unspecified, third trimester: Secondary | ICD-10-CM | POA: Diagnosis not present

## 2021-05-24 DIAGNOSIS — O98513 Other viral diseases complicating pregnancy, third trimester: Secondary | ICD-10-CM | POA: Diagnosis not present

## 2021-05-24 DIAGNOSIS — Z3A37 37 weeks gestation of pregnancy: Secondary | ICD-10-CM | POA: Diagnosis not present

## 2021-05-24 DIAGNOSIS — O99824 Streptococcus B carrier state complicating childbirth: Secondary | ICD-10-CM | POA: Diagnosis not present

## 2021-05-24 DIAGNOSIS — Z23 Encounter for immunization: Secondary | ICD-10-CM | POA: Diagnosis not present

## 2021-05-24 DIAGNOSIS — B009 Herpesviral infection, unspecified: Secondary | ICD-10-CM | POA: Diagnosis not present

## 2021-05-24 DIAGNOSIS — F1290 Cannabis use, unspecified, uncomplicated: Secondary | ICD-10-CM | POA: Diagnosis not present

## 2021-05-24 DIAGNOSIS — O4202 Full-term premature rupture of membranes, onset of labor within 24 hours of rupture: Secondary | ICD-10-CM | POA: Diagnosis not present

## 2021-05-24 DIAGNOSIS — O99323 Drug use complicating pregnancy, third trimester: Secondary | ICD-10-CM | POA: Diagnosis not present

## 2021-06-02 DIAGNOSIS — Z3482 Encounter for supervision of other normal pregnancy, second trimester: Secondary | ICD-10-CM | POA: Diagnosis not present

## 2021-06-02 DIAGNOSIS — Z3483 Encounter for supervision of other normal pregnancy, third trimester: Secondary | ICD-10-CM | POA: Diagnosis not present

## 2022-05-06 DIAGNOSIS — R350 Frequency of micturition: Secondary | ICD-10-CM | POA: Diagnosis not present

## 2022-05-06 DIAGNOSIS — R110 Nausea: Secondary | ICD-10-CM | POA: Diagnosis not present

## 2022-05-06 DIAGNOSIS — R300 Dysuria: Secondary | ICD-10-CM | POA: Diagnosis not present

## 2022-05-29 ENCOUNTER — Emergency Department (HOSPITAL_BASED_OUTPATIENT_CLINIC_OR_DEPARTMENT_OTHER): Payer: Medicaid Other

## 2022-05-29 ENCOUNTER — Other Ambulatory Visit: Payer: Self-pay

## 2022-05-29 ENCOUNTER — Encounter (HOSPITAL_BASED_OUTPATIENT_CLINIC_OR_DEPARTMENT_OTHER): Payer: Self-pay

## 2022-05-29 ENCOUNTER — Emergency Department (HOSPITAL_BASED_OUTPATIENT_CLINIC_OR_DEPARTMENT_OTHER)
Admission: EM | Admit: 2022-05-29 | Discharge: 2022-05-29 | Disposition: A | Discharge status: Home / Self Care | Payer: Medicaid Other | Attending: Emergency Medicine | Admitting: Emergency Medicine

## 2022-05-29 DIAGNOSIS — J45909 Unspecified asthma, uncomplicated: Secondary | ICD-10-CM | POA: Diagnosis not present

## 2022-05-29 DIAGNOSIS — R61 Generalized hyperhidrosis: Secondary | ICD-10-CM | POA: Diagnosis not present

## 2022-05-29 DIAGNOSIS — R002 Palpitations: Secondary | ICD-10-CM

## 2022-05-29 DIAGNOSIS — R55 Syncope and collapse: Secondary | ICD-10-CM | POA: Diagnosis not present

## 2022-05-29 DIAGNOSIS — F419 Anxiety disorder, unspecified: Secondary | ICD-10-CM | POA: Diagnosis not present

## 2022-05-29 DIAGNOSIS — R1110 Vomiting, unspecified: Secondary | ICD-10-CM | POA: Diagnosis not present

## 2022-05-29 DIAGNOSIS — R112 Nausea with vomiting, unspecified: Secondary | ICD-10-CM | POA: Diagnosis not present

## 2022-05-29 DIAGNOSIS — R410 Disorientation, unspecified: Secondary | ICD-10-CM | POA: Diagnosis not present

## 2022-05-29 DIAGNOSIS — F411 Generalized anxiety disorder: Secondary | ICD-10-CM

## 2022-05-29 DIAGNOSIS — R140 Abdominal distension (gaseous): Secondary | ICD-10-CM | POA: Diagnosis not present

## 2022-05-29 DIAGNOSIS — R109 Unspecified abdominal pain: Secondary | ICD-10-CM | POA: Diagnosis not present

## 2022-05-29 DIAGNOSIS — K429 Umbilical hernia without obstruction or gangrene: Secondary | ICD-10-CM

## 2022-05-29 DIAGNOSIS — R110 Nausea: Secondary | ICD-10-CM

## 2022-05-29 LAB — CBC WITH DIFFERENTIAL/PLATELET
Abs Immature Granulocytes: 0.02 10*3/uL (ref 0.00–0.07)
Basophils Absolute: 0 10*3/uL (ref 0.0–0.1)
Basophils Relative: 0 %
Eosinophils Absolute: 0 10*3/uL (ref 0.0–0.5)
Eosinophils Relative: 0 %
HCT: 43.3 % (ref 36.0–46.0)
Hemoglobin: 15.6 g/dL — ABNORMAL HIGH (ref 12.0–15.0)
Immature Granulocytes: 0 %
Lymphocytes Relative: 20 %
Lymphs Abs: 1.3 10*3/uL (ref 0.7–4.0)
MCH: 31.6 pg (ref 26.0–34.0)
MCHC: 36 g/dL (ref 30.0–36.0)
MCV: 87.8 fL (ref 80.0–100.0)
Monocytes Absolute: 0.4 10*3/uL (ref 0.1–1.0)
Monocytes Relative: 7 %
Neutro Abs: 4.6 10*3/uL (ref 1.7–7.7)
Neutrophils Relative %: 73 %
Platelets: 237 10*3/uL (ref 150–400)
RBC: 4.93 MIL/uL (ref 3.87–5.11)
RDW: 12 % (ref 11.5–15.5)
WBC: 6.4 10*3/uL (ref 4.0–10.5)
nRBC: 0 % (ref 0.0–0.2)

## 2022-05-29 LAB — COMPREHENSIVE METABOLIC PANEL
ALT: 12 U/L (ref 0–44)
AST: 17 U/L (ref 15–41)
Albumin: 4.2 g/dL (ref 3.5–5.0)
Alkaline Phosphatase: 58 U/L (ref 38–126)
Anion gap: 8 (ref 5–15)
BUN: 11 mg/dL (ref 6–20)
CO2: 24 mmol/L (ref 22–32)
Calcium: 9.3 mg/dL (ref 8.9–10.3)
Chloride: 107 mmol/L (ref 98–111)
Creatinine, Ser: 0.79 mg/dL (ref 0.44–1.00)
GFR, Estimated: >60 mL/min (ref >60)
Glucose, Bld: 119 mg/dL — ABNORMAL HIGH (ref 70–99)
Potassium: 3.9 mmol/L (ref 3.5–5.1)
Sodium: 139 mmol/L (ref 135–145)
Total Bilirubin: 1.1 mg/dL (ref 0.3–1.2)
Total Protein: 7.4 g/dL (ref 6.5–8.1)

## 2022-05-29 LAB — PREGNANCY, URINE: Preg Test, Ur: NEGATIVE

## 2022-05-29 LAB — LIPASE, BLOOD: Lipase: 24 U/L (ref 11–51)

## 2022-05-29 LAB — URINALYSIS, ROUTINE W REFLEX MICROSCOPIC
Glucose, UA: NEGATIVE mg/dL
Hgb urine dipstick: NEGATIVE
Ketones, ur: 80 mg/dL — AB
Leukocytes,Ua: NEGATIVE
Nitrite: NEGATIVE
Protein, ur: NEGATIVE mg/dL
Specific Gravity, Urine: 1.025 (ref 1.005–1.030)
pH: 7 (ref 5.0–8.0)

## 2022-05-29 LAB — TROPONIN I (HIGH SENSITIVITY): Troponin I (High Sensitivity): <2 ng/L (ref <18)

## 2022-05-29 LAB — TSH: TSH: 0.916 u[IU]/mL (ref 0.350–4.500)

## 2022-05-29 MED ORDER — ACETAMINOPHEN 325 MG PO TABS
650.0000 mg | ORAL_TABLET | Freq: Once | ORAL | Status: AC
  Administered 2022-05-29: 650 mg via ORAL
  Filled 2022-05-29: qty 2

## 2022-05-29 MED ORDER — IOHEXOL 300 MG/ML  SOLN
100.0000 mL | Freq: Once | INTRAMUSCULAR | Status: AC | PRN
  Administered 2022-05-29: 100 mL via INTRAVENOUS

## 2022-05-29 MED ORDER — LORAZEPAM 2 MG/ML IJ SOLN
1.0000 mg | Freq: Once | INTRAMUSCULAR | Status: AC
  Administered 2022-05-29: 1 mg via INTRAVENOUS
  Filled 2022-05-29: qty 1

## 2022-05-29 MED ORDER — SODIUM CHLORIDE 0.9 % IV BOLUS
1000.0000 mL | Freq: Once | INTRAVENOUS | Status: AC
  Administered 2022-05-29: 1000 mL via INTRAVENOUS

## 2022-05-29 MED ORDER — LORAZEPAM 1 MG PO TABS: 1.0000 mg | ORAL_TABLET | Freq: Three times a day (TID) | ORAL | 0 refills | Status: AC | PRN

## 2022-05-29 MED ORDER — ONDANSETRON HCL 4 MG/2ML IJ SOLN: 4.0000 mg | Freq: Once | INTRAMUSCULAR | Status: DC

## 2022-05-29 NOTE — ED Triage Notes (Addendum)
States has been feeling unwell, has been feeling like she is having an ongoing panic attack since Friday. When she drinks water she "starts sweating". Emesis x 1 today, constipation.   Normally smokes marijuana daily, has not smoked in 3 days. States she has been "getting confused". States syncopal episode onto bed yesterday, also asking for antianxiety meds

## 2022-05-29 NOTE — ED Notes (Signed)
Pt given ginger ale.

## 2022-05-29 NOTE — ED Provider Notes (Signed)
[facility] EMERGENCY DEPARTMENT Provider Note   CSN: [id number] Arrival date & time: 05/29/22  0808     History  Chief Complaint  Patient presents with   Loss of Consciousness   Nausea    Deborah Morgan is a 26 y.o. female.  The history is provided by the patient and medical records. No language interpreter was used.  Loss of Consciousness Episode history:  Single Most recent episode:  Yesterday Timing:  Unable to specify Progression:  Unable to specify Context: standing up   Witnessed: no   Relieved by:  Nothing Worsened by:  Nothing Ineffective treatments:  None tried Associated symptoms: diaphoresis, nausea and vomiting   Associated symptoms: no chest pain, no confusion, no dizziness, no fever, no headaches, no malaise/fatigue, no palpitations, no shortness of breath and no weakness        Home Medications Prior to Admission medications   Medication Sig Start Date End Date Taking? Authorizing Provider  acetaminophen (TYLENOL) 500 MG tablet Take 1 tablet (500 mg total) by mouth every 6 (six) hours as needed. 04/16/17   Law, Waylan Boga, PA-C  albuterol (VENTOLIN HFA) 108 (90 Base) MCG/ACT inhaler Inhale 2 puffs into the lungs every 6 (six) hours as needed. 05/29/20   [provider]  cyclobenzaprine (FLEXERIL) 10 MG tablet Take 1 tablet (10 mg total) by mouth 2 (two) times daily as needed for muscle spasms. 04/16/17   Law, Waylan Boga, PA-C  doxylamine, Sleep, (UNISOM) 25 MG tablet Take 25 mg by mouth at bedtime as needed.    [provider]  Doxylamine-Pyridoxine 10-10 MG TBEC Take 2 tablets by mouth at bedtime as needed. 10/24/20   Terrilee Files, MD  hydrOXYzine (ATARAX/VISTARIL) 10 MG tablet Take by mouth.    [provider]  ibuprofen (ADVIL,MOTRIN) 200 MG tablet Take 400 mg by mouth every 6 (six) hours as needed for mild pain.    [provider]  ibuprofen (ADVIL,MOTRIN) 800 MG tablet Take 1 tablet (800 mg total) by  mouth 3 (three) times daily. 04/16/17   Law, Waylan Boga, PA-C  lamoTRIgine (LAMICTAL) 25 MG tablet Take 50 mg by mouth daily.    [provider]  Melatonin 10 MG TABS Take by mouth.    [provider]  metoCLOPramide (REGLAN) 10 MG tablet Take 1 tablet (10 mg total) by mouth every 8 (eight) hours as needed for nausea or vomiting. 10/24/20   Terrilee Files, MD  ondansetron (ZOFRAN) 4 MG tablet Take 1 tablet (4 mg total) by mouth every 6 (six) hours. 03/16/21   Theron Arista, PA-C  sertraline (ZOLOFT) 50 MG tablet Take 50 mg by mouth daily.    [provider]      Allergies    Hydrocodone and Codeine    Review of Systems   Review of Systems  Constitutional:  Positive for diaphoresis. Negative for chills, fatigue, fever and malaise/fatigue.  HENT:  Negative for congestion.   Eyes:  Negative for visual disturbance.  Respiratory:  Negative for cough, chest tightness, shortness of breath and wheezing.   Cardiovascular:  Positive for syncope. Negative for chest pain, palpitations and leg swelling.  Gastrointestinal:  Positive for abdominal distention, abdominal pain (mild), constipation, nausea and vomiting. Negative for diarrhea.  Genitourinary:  Positive for dysuria. Negative for flank pain and frequency.  Musculoskeletal:  Negative for back pain, neck pain and neck stiffness.  Skin:  Negative for rash and wound.  Neurological:  Positive for tremors, syncope, light-headedness  and numbness (intermittnet tingling in all exremities). Negative for dizziness, weakness and headaches.  Psychiatric/Behavioral:  Positive for sleep disturbance. Negative for agitation and confusion. The patient is nervous/anxious.   All other systems reviewed and are negative.   Physical Exam Updated Vital Signs BP 119/79   Pulse 92   Temp 98.9 F (37.2 C) (Oral)   Resp 18   Ht 5\' 3"  (1.6 m)   Wt 70.3 kg   LMP 05/13/2022   SpO2 99%   BMI 27.46 kg/m  Physical Exam Vitals and nursing  note reviewed.  Constitutional:      General: She is not in acute distress.    Appearance: She is well-developed. She is not ill-appearing, toxic-appearing or diaphoretic.  HENT:     Head: Normocephalic and atraumatic.     Nose: No congestion or rhinorrhea.     Mouth/Throat:     Mouth: Mucous membranes are dry.     Pharynx: No oropharyngeal exudate or posterior oropharyngeal erythema.  Eyes:     Extraocular Movements: Extraocular movements intact.     Conjunctiva/sclera: Conjunctivae normal.     Pupils: Pupils are equal, round, and reactive to light.  Cardiovascular:     Rate and Rhythm: Normal rate and regular rhythm.     Heart sounds: No murmur heard. Pulmonary:     Effort: Pulmonary effort is normal. No respiratory distress.     Breath sounds: Normal breath sounds. No wheezing, rhonchi or rales.  Chest:     Chest wall: No tenderness.  Abdominal:     General: Abdomen is flat.     Palpations: Abdomen is soft.     Tenderness: There is abdominal tenderness (very mild ache with palpation). There is no right CVA tenderness, left CVA tenderness, guarding or rebound.  Musculoskeletal:        General: No swelling, tenderness or signs of injury.     Cervical back: Neck supple. No tenderness.     Right lower leg: No edema.     Left lower leg: No edema.  Skin:    General: Skin is warm and dry.     Capillary Refill: Capillary refill takes less than 2 seconds.     Findings: No erythema or rash.  Neurological:     General: No focal deficit present.     Mental Status: She is alert.     Sensory: No sensory deficit.     Motor: No weakness.  Psychiatric:        Mood and Affect: Mood is anxious.     ED Results / Procedures / Treatments   Labs (all labs ordered are listed, but only abnormal results are displayed) Labs Reviewed  CBC WITH DIFFERENTIAL/PLATELET - Abnormal; Notable for the following components:      Result Value   Hemoglobin 15.6 (*)    All other components within  normal limits  COMPREHENSIVE METABOLIC PANEL - Abnormal; Notable for the following components:   Glucose, Bld 119 (*)    All other components within normal limits  URINALYSIS, ROUTINE W REFLEX MICROSCOPIC - Abnormal; Notable for the following components:   APPearance HAZY (*)    Bilirubin Urine SMALL (*)    Ketones, ur 80 (*)    All other components within normal limits  URINE CULTURE  LIPASE, BLOOD  PREGNANCY, URINE  TSH  TROPONIN I (HIGH SENSITIVITY)  TROPONIN I (HIGH SENSITIVITY)    EKG EKG Interpretation  Date/Time:  Monday May 29 2022 08:24:11 EDT Ventricular Rate:  86 PR  Interval:  159 QRS Duration: 77 QT Interval:  363 QTC Calculation: 435 R Axis:   74 Text Interpretation: Sinus rhythm Nonspecific T abnormalities, anterior leads no prior ECG for comparison. NO STEMI Confirmed by Theda Belfast ([id number]) on 05/29/2022 8:47:17 AM  Radiology CT ABDOMEN PELVIS W CONTRAST  Result Date: 05/29/2022 CLINICAL DATA:  Nausea, vomiting, abdominal distension, decreased bowel movements, diaphoresis, single episode of vomiting today, suspected bowel obstruction EXAM: CT ABDOMEN AND PELVIS WITH CONTRAST TECHNIQUE: Multidetector CT imaging of the abdomen and pelvis was performed using the standard protocol following bolus administration of intravenous contrast. RADIATION DOSE REDUCTION: This exam was performed according to the departmental dose-optimization program which includes automated exposure control, adjustment of the mA and/or kV according to patient size and/or use of iterative reconstruction technique. CONTRAST:  OMNIPAQUE IOHEXOL 300 MG/ML SOLN IV. No oral contrast. COMPARISON:  None Available. FINDINGS: Lower chest: Lung bases clear Hepatobiliary: Gallbladder and liver normal appearance Pancreas: Normal appearance Spleen: Normal appearance Adrenals/Urinary Tract: Adrenal glands, kidneys, ureters, and bladder normal appearance. No urinary tract calcification or dilatation.  Stomach/Bowel: Normal appendix. Stomach and bowel loops normal appearance Vascular/Lymphatic: Vascular structures patent. Aorta normal caliber. No adenopathy. Reproductive: Unremarkable uterus and ovaries for age Other: No free air or free fluid. Small umbilical hernia containing fat. No inflammatory process. Musculoskeletal: Unremarkable IMPRESSION: Small umbilical hernia containing fat. Otherwise negative exam. Electronically Signed   By: Ulyses Southward M.D.   On: 05/29/2022 09:58   DG Chest Portable 1 View  Result Date: 05/29/2022 CLINICAL DATA:  Syncope. Confusion, tingling in the extremities, and vomiting. EXAM: PORTABLE CHEST 1 VIEW COMPARISON:  Chest radiographs 11/19/2017 FINDINGS: The cardiomediastinal silhouette is within normal limits. The lungs are well inflated. No airspace consolidation, edema, sizable pleural effusion, or pneumothorax is identified. No acute osseous abnormality is seen. IMPRESSION: No active disease. Electronically Signed   By: Sebastian Ache M.D.   On: 05/29/2022 09:12    Procedures Procedures    Medications Ordered in ED Medications  ondansetron (ZOFRAN) injection 4 mg (4 mg Intravenous Patient Refused/Not Given 05/29/22 0858)  LORazepam (ATIVAN) injection 1 mg (1 mg Intravenous Given 05/29/22 0857)  sodium chloride 0.9 % bolus 1,000 mL (0 mLs Intravenous Stopped 05/29/22 1029)  iohexol (OMNIPAQUE) 300 MG/ML solution 100 mL (100 mLs Intravenous Contrast Given 05/29/22 0942)  acetaminophen (TYLENOL) tablet 650 mg (650 mg Oral Given 05/29/22 1209)    ED Course/ Medical Decision Making/ A&P                           Medical Decision Making Amount and/or Complexity of Data Reviewed Labs: ordered. Radiology: ordered.  Risk Prescription drug management.    MARGARIE MCGUIRT is a 26 y.o. 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  The patient reports that she was very anxious and nervous about a birthday party for her 37-year-old over the weekend and felt that she did not get it together.  She reports that she was very nervous and stressed and having panic attacks constantly.  She does not take medication for it.  She says that she was getting worked up to the point where she was having a tingling feeling in both her arms and legs and then was having lightheaded sensation.  She denied any headache or neck pain and denies any chest pain but thought she could be having a heart attack given that she was sweaty and not feeling right.  She denies any shortness of breath, congestion, or cough.  Denies any abdominal pain back pain, or flank pain.  She does report that her urine felt hotter and had some discomfort with urination.  She reported no vaginal discharge or vaginal bleeding and denies any diarrhea.  She does report some constipation.  She has no history of bowel obstruction or previous abdominal surgeries.  She said that the syncopal episode was yesterday and she passed out onto the bed.  No other trauma was reported.  She denied any palpitations or chest pain during the episode.  On exam, lungs clear and chest nontender.  Patient is slight tachycardic and very anxious.  She is tremulous and shaking all over.  She had no focal neurologic deficits with intact sensation and strength and pulses in extremities.  Symmetric smile.  Clear speech.  Neck was nontender.  Chest and back nontender.  Abdomen was slightly tender to palpation mildly.  She did have some bowel sounds appreciated.  No other complaints reported  EKG does not show STEMI.  Clinically suspect she is having a prolonged panic attack over the weekend causing her symptoms however, given her diaphoresis and syncope and the lightheaded and nausea, it is reasonable to rule  out MI by getting troponins and other labs.  We will also get chest x-ray and due to the abdominal distention with nausea, vomiting, constipation, we will get a CT abdomen pelvis.  We will give her some fluids for suspected dehydration with dry mouth as well as some nausea medicine and anxiety medicine.  We will check urinalysis given the hot sensation with urination.  If work-up is reassuring, dissipate discharge with a course of some Ativan and instructions to follow-up and get reestablished with her PCP for ongoing anxiety and panic management.  10:54 AM Work-up began to return and was more more reassuring.  I do suspect there is degree of dehydration with some ketones in the urine and some hemoconcentration with elevated hemoglobin but otherwise work-up reassuring.  TSH is send out some but not return today but she will follow-up with her PCP for this result.  Otherwise troponin was negative.  CT imaging and chest x-ray did not show acute cause of her symptoms.  We did see a small abdominal hernia which patient knows about and that was nonspecifically tender on exam today.  We will give her follow-up with general surgery to follow-up for this and we will do a p.o. challenge.  If she passes p.o. challenge, we will give her a short course of some Ativan and I do feel she needs to follow-up with a PCP to discuss further more long-term medicine to help with her anxiety and panic attacks.  Patient agrees with this plan.  Anticipate discharge after p.o. challenge.  Patient passed p.o. challenge.  She have a small headache that was given Tylenol and.  She will follow-up with PCP and was given a's short course of Ativan.  She understands plan of care and return precautions and was discharged in good condition follow PCP for the anxiety stress as well as general surgery for the small abdominal hernia.         Final Clinical Impression(s) / ED Diagnoses Final diagnoses:  Palpitations  Nausea   Abdominal pain, unspecified abdominal location  Umbilical hernia without obstruction  and without gangrene  Anxiety state    Rx / DC Orders ED Discharge Orders          Ordered    LORazepam (ATIVAN) 1 MG tablet  Every 8 hours PRN        05/29/22 1213           Clinical Impression: 1. Palpitations   2. Nausea   3. Abdominal pain, unspecified abdominal location   4. Umbilical hernia without obstruction and without gangrene   5. Anxiety state     Disposition: Discharge  Condition: Good  I have discussed the results, Dx and Tx plan with the pt(& family if present). He/she/they expressed understanding and agree(s) with the plan. Discharge instructions discussed at great length. Strict return precautions discussed and pt &/or family have verbalized understanding of the instructions. No further questions at time of discharge.    New Prescriptions   LORAZEPAM (ATIVAN) 1 MG TABLET    Take 1 tablet (1 mg total) by mouth every 8 (eight) hours as needed for anxiety.    Follow Up: Surgery, [facility] [address] [place] [address] [phone]   For the small abdominal hernia  [facility] [address] [place] [address] [place] [address] [phone] Schedule an appointment as soon as possible for a visit    Lesia Sago, MD     [facility] EM[facility]PART[facility] [phone] [place] [address] [phone]       Sharrieff Spratlin, Canary Brim, MD 05/29/22 1218

## 2022-05-29 NOTE — ED Notes (Signed)
Pt understands NOT to mix pot with rx meds she received

## 2022-05-29 NOTE — ED Notes (Signed)
To x-ray

## 2022-05-29 NOTE — Discharge Instructions (Addendum)
Your work-up today was overall reassuring but did show the small hernia as we discussed.  Please call and follow-up with general surgery for this.  I do suspect that the anxiety and stress as we discussed contributed to your symptoms however after some fluids and monitoring and medication your symptoms have improved.  Please rest and stay hydrated and use some of the Ativan to help with your panic and anxiety start to worsen.  You will need to follow-up with a regular doctor to get established and determine if other medications are needed for more long-term management. If any symptoms change or worsen acutely, please return to the nearest emergency department.

## 2022-05-30 DIAGNOSIS — F419 Anxiety disorder, unspecified: Secondary | ICD-10-CM | POA: Diagnosis not present

## 2022-05-30 DIAGNOSIS — R112 Nausea with vomiting, unspecified: Secondary | ICD-10-CM | POA: Diagnosis not present

## 2022-05-30 DIAGNOSIS — F319 Bipolar disorder, unspecified: Secondary | ICD-10-CM | POA: Diagnosis not present

## 2022-05-30 LAB — URINE CULTURE: Culture: NO GROWTH

## 2022-05-31 DIAGNOSIS — F419 Anxiety disorder, unspecified: Secondary | ICD-10-CM | POA: Diagnosis not present

## 2022-05-31 DIAGNOSIS — T887XXA Unspecified adverse effect of drug or medicament, initial encounter: Secondary | ICD-10-CM | POA: Diagnosis not present

## 2022-05-31 DIAGNOSIS — F319 Bipolar disorder, unspecified: Secondary | ICD-10-CM | POA: Diagnosis not present

## 2022-06-22 DIAGNOSIS — R197 Diarrhea, unspecified: Secondary | ICD-10-CM | POA: Diagnosis not present

## 2022-06-22 DIAGNOSIS — J309 Allergic rhinitis, unspecified: Secondary | ICD-10-CM | POA: Diagnosis not present

## 2022-06-22 DIAGNOSIS — R0982 Postnasal drip: Secondary | ICD-10-CM | POA: Diagnosis not present

## 2022-06-22 DIAGNOSIS — R112 Nausea with vomiting, unspecified: Secondary | ICD-10-CM | POA: Diagnosis not present

## 2022-06-22 DIAGNOSIS — R059 Cough, unspecified: Secondary | ICD-10-CM | POA: Diagnosis not present

## 2022-11-11 ENCOUNTER — Other Ambulatory Visit: Payer: Self-pay

## 2022-11-11 ENCOUNTER — Encounter (HOSPITAL_BASED_OUTPATIENT_CLINIC_OR_DEPARTMENT_OTHER): Payer: Self-pay

## 2022-11-11 ENCOUNTER — Emergency Department (HOSPITAL_BASED_OUTPATIENT_CLINIC_OR_DEPARTMENT_OTHER): Payer: Medicaid Other

## 2022-11-11 ENCOUNTER — Emergency Department (HOSPITAL_BASED_OUTPATIENT_CLINIC_OR_DEPARTMENT_OTHER)
Admission: EM | Admit: 2022-11-11 | Discharge: 2022-11-11 | Disposition: A | Discharge status: Home / Self Care | Payer: Medicaid Other | Attending: Emergency Medicine | Admitting: Emergency Medicine

## 2022-11-11 DIAGNOSIS — R1033 Periumbilical pain: Secondary | ICD-10-CM

## 2022-11-11 LAB — COMPREHENSIVE METABOLIC PANEL
ALT: 17 U/L (ref 0–44)
AST: 21 U/L (ref 15–41)
Albumin: 4.2 g/dL (ref 3.5–5.0)
Alkaline Phosphatase: 62 U/L (ref 38–126)
Anion gap: 6 (ref 5–15)
BUN: 15 mg/dL (ref 6–20)
CO2: 26 mmol/L (ref 22–32)
Calcium: 8.8 mg/dL — ABNORMAL LOW (ref 8.9–10.3)
Chloride: 103 mmol/L (ref 98–111)
Creatinine, Ser: 0.8 mg/dL (ref 0.44–1.00)
GFR, Estimated: >60 mL/min (ref >60)
Glucose, Bld: 110 mg/dL — ABNORMAL HIGH (ref 70–99)
Potassium: 3.7 mmol/L (ref 3.5–5.1)
Sodium: 135 mmol/L (ref 135–145)
Total Bilirubin: 0.7 mg/dL (ref 0.3–1.2)
Total Protein: 7.8 g/dL (ref 6.5–8.1)

## 2022-11-11 LAB — CBC
HCT: 42.7 % (ref 36.0–46.0)
Hemoglobin: 15.5 g/dL — ABNORMAL HIGH (ref 12.0–15.0)
MCH: 32.1 pg (ref 26.0–34.0)
MCHC: 36.3 g/dL — ABNORMAL HIGH (ref 30.0–36.0)
MCV: 88.4 fL (ref 80.0–100.0)
Platelets: 290 10*3/uL (ref 150–400)
RBC: 4.83 MIL/uL (ref 3.87–5.11)
RDW: 11.6 % (ref 11.5–15.5)
WBC: 6.4 10*3/uL (ref 4.0–10.5)
nRBC: 0 % (ref 0.0–0.2)

## 2022-11-11 LAB — URINALYSIS, ROUTINE W REFLEX MICROSCOPIC
Bilirubin Urine: NEGATIVE
Glucose, UA: NEGATIVE mg/dL
Hgb urine dipstick: NEGATIVE
Ketones, ur: NEGATIVE mg/dL
Leukocytes,Ua: NEGATIVE
Nitrite: NEGATIVE
Protein, ur: NEGATIVE mg/dL
Specific Gravity, Urine: 1.015 (ref 1.005–1.030)
pH: 7 (ref 5.0–8.0)

## 2022-11-11 LAB — LIPASE, BLOOD: Lipase: 26 U/L (ref 11–51)

## 2022-11-11 LAB — PREGNANCY, URINE: Preg Test, Ur: NEGATIVE

## 2022-11-11 MED ORDER — ONDANSETRON HCL 4 MG/2ML IJ SOLN
4.0000 mg | Freq: Once | INTRAMUSCULAR | Status: AC
  Administered 2022-11-11: 4 mg via INTRAVENOUS
  Filled 2022-11-11: qty 2

## 2022-11-11 MED ORDER — SODIUM CHLORIDE 0.9 % IV BOLUS
1000.0000 mL | Freq: Once | INTRAVENOUS | Status: AC
  Administered 2022-11-11: 1000 mL via INTRAVENOUS

## 2022-11-11 MED ORDER — ACETAMINOPHEN 500 MG PO TABS: 500.0000 mg | ORAL_TABLET | Freq: Four times a day (QID) | ORAL | 0 refills | Status: AC | PRN

## 2022-11-11 MED ORDER — ONDANSETRON HCL 4 MG PO TABS: 4.0000 mg | ORAL_TABLET | Freq: Three times a day (TID) | ORAL | 0 refills | Status: AC | PRN

## 2022-11-11 MED ORDER — MORPHINE SULFATE (PF) 4 MG/ML IV SOLN
4.0000 mg | Freq: Once | INTRAVENOUS | Status: AC
  Administered 2022-11-11: 4 mg via INTRAVENOUS
  Filled 2022-11-11: qty 1

## 2022-11-11 NOTE — ED Provider Notes (Signed)
[facility] [facility] [facility] Provider Note   CSN: [id number] Arrival date & time: 11/11/22  1655     History  Chief Complaint  Patient presents with   Abdominal Pain    Deborah Morgan is a 27 y.o. female.  The history is provided by the patient and medical records. No language interpreter was used.  Abdominal Pain    27 year old female significant history of alcohol use, bipolar, umbilical hernia, presenting complaining of abdominal pain.  Patient report while walking on her walker this morning she developed pain to her abdomen.  Pain is localized to the umbilicus and described as an achy throbbing sensation.  She also endorsed nausea and feels as if she was going to have some diarrhea.  She does not endorse any fever or chills no chest pain or shortness of breath no urinary symptoms last menstrual period was a week ago.  She was told that she has an umbilical hernia and was unsure if this is related to that.  She denies any heavy lifting or doing any thing strenuous.  Pain is mild to moderate in severity.  Patient states she was told that she has an umbilical hernia from a CT scan a year ago after her pregnancy.  Home Medications Prior to Admission medications   Medication Sig Start Date End Date Taking? Authorizing Provider  acetaminophen (TYLENOL) 500 MG tablet Take 1 tablet (500 mg total) by mouth every 6 (six) hours as needed. 04/16/17   Law, Bea Graff, PA-C  albuterol (VENTOLIN HFA) 108 (90 Base) MCG/ACT inhaler Inhale 2 puffs into the lungs every 6 (six) hours as needed. 05/29/20   [provider]  cyclobenzaprine (FLEXERIL) 10 MG tablet Take 1 tablet (10 mg total) by mouth 2 (two) times daily as needed for muscle spasms. 04/16/17   Law, Bea Graff, PA-C  doxylamine, Sleep, (UNISOM) 25 MG tablet Take 25 mg by mouth at bedtime as needed.    [provider]  Doxylamine-Pyridoxine 10-10 MG TBEC Take 2 tablets by mouth at bedtime as  needed. 10/24/20   Hayden Rasmussen, MD  hydrOXYzine (ATARAX/VISTARIL) 10 MG tablet Take by mouth.    [provider]  ibuprofen (ADVIL,MOTRIN) 200 MG tablet Take 400 mg by mouth every 6 (six) hours as needed for mild pain.    [provider]  ibuprofen (ADVIL,MOTRIN) 800 MG tablet Take 1 tablet (800 mg total) by mouth 3 (three) times daily. 04/16/17   Law, Bea Graff, PA-C  lamoTRIgine (LAMICTAL) 25 MG tablet Take 50 mg by mouth daily.    [provider]  LORazepam (ATIVAN) 1 MG tablet Take 1 tablet (1 mg total) by mouth every 8 (eight) hours as needed for anxiety. 05/29/22   Tegeler, Gwenyth Allegra, MD  Melatonin 10 MG TABS Take by mouth.    [provider]  metoCLOPramide (REGLAN) 10 MG tablet Take 1 tablet (10 mg total) by mouth every 8 (eight) hours as needed for nausea or vomiting. 10/24/20   Hayden Rasmussen, MD  ondansetron (ZOFRAN) 4 MG tablet Take 1 tablet (4 mg total) by mouth every 6 (six) hours. 03/16/21   Sherrill Raring, PA-C  sertraline (ZOLOFT) 50 MG tablet Take 50 mg by mouth daily.    [provider]      Allergies    Hydrocodone and Codeine    Review of Systems   Review of Systems  Gastrointestinal:  Positive for abdominal pain.  All other systems reviewed and are  negative.   Physical Exam Updated Vital Signs BP (!) 125/90 (BP Location: Left Arm)   Pulse (!) 103   Temp 98.4 F (36.9 C) (Oral)   Resp 18   LMP 10/30/2022 (Approximate)   SpO2 99%  Physical Exam Vitals and nursing note reviewed.  Constitutional:      General: She is not in acute distress.    Appearance: She is well-developed. She is obese.  HENT:     Head: Atraumatic.  Eyes:     Conjunctiva/sclera: Conjunctivae normal.  Cardiovascular:     Rate and Rhythm: Normal rate and regular rhythm.  Pulmonary:     Effort: Pulmonary effort is normal.  Abdominal:     Tenderness: There is abdominal tenderness in the periumbilical area. There is no guarding or  rebound. Negative signs include Murphy's sign, Rovsing's sign and McBurney's sign.     Hernia: No hernia is present. There is no hernia in the umbilical area or ventral area.  Musculoskeletal:     Cervical back: Neck supple.  Skin:    Findings: No rash.  Neurological:     Mental Status: She is alert.  Psychiatric:        Mood and Affect: Mood normal.     ED Results / Procedures / Treatments   Labs (all labs ordered are listed, but only abnormal results are displayed) Labs Reviewed  COMPREHENSIVE METABOLIC PANEL - Abnormal; Notable for the following components:      Result Value   Glucose, Bld 110 (*)    Calcium 8.8 (*)    All other components within normal limits  CBC - Abnormal; Notable for the following components:   Hemoglobin 15.5 (*)    MCHC 36.3 (*)    All other components within normal limits  LIPASE, BLOOD  URINALYSIS, ROUTINE W REFLEX MICROSCOPIC  PREGNANCY, URINE    EKG None  Radiology DG ABD ACUTE 2+V W 1V CHEST  Result Date: 11/11/2022 CLINICAL DATA:  Small bowel obstruction. EXAM: DG ABDOMEN ACUTE WITH 1 VIEW CHEST COMPARISON:  Chest radiograph dated 05/29/2022. FINDINGS: The lungs are clear. There is no pleural effusion pneumothorax. The cardiac silhouette is within limits. Moderate stool throughout the colon. No bowel dilatation or evidence of obstruction. No free air or radiopaque calculi. The osseous structures are intact. The soft tissues are unremarkable. IMPRESSION: 1. No acute cardiopulmonary process. 2. Moderate colonic stool burden. No bowel obstruction. Electronically Signed   By: Anner Crete M.D.   On: 11/11/2022 18:38    Procedures Procedures    Medications Ordered in ED Medications  sodium chloride 0.9 % bolus 1,000 mL (1,000 mLs Intravenous New Bag/Given 11/11/22 1758)  ondansetron (ZOFRAN) injection 4 mg (4 mg Intravenous Given 11/11/22 1755)  morphine (PF) 4 MG/ML injection 4 mg (4 mg Intravenous Given 11/11/22 1756)    ED Course/ Medical  Decision Making/ A&P                             Medical Decision Making Amount and/or Complexity of Data Reviewed Labs: ordered. Radiology: ordered.  Risk Prescription drug management.   BP (!) 125/90 (BP Location: Left Arm)   Pulse (!) 103   Temp 98.4 F (36.9 C) (Oral)   Resp 18   LMP 10/30/2022 (Approximate)   SpO2 99%   47:53 PM 27 year old female significant history of alcohol use, bipolar, umbilical hernia, presenting complaining of abdominal pain.  Patient report while walking on her walker this  morning she developed pain to her abdomen.  Pain is localized to the umbilicus and described as an achy throbbing sensation.  She also endorsed nausea and feels as if she was going to have some diarrhea.  She does not endorse any fever or chills no chest pain or shortness of breath no urinary symptoms last menstrual period was a week ago.  She was told that she has an umbilical hernia and was unsure if this is related to that.  She denies any heavy lifting or doing any thing strenuous.  Pain is mild to moderate in severity.  Patient states she was told that she has an umbilical hernia from a CT scan a year ago after her pregnancy.  On exam, patient is laying bed appears to be in no acute discomfort.  Heart with normal rate and rhythm, lungs are clear to auscultation bilaterally abdomen is soft with some mild paramedical tenderness but no obvious hernia noted.  No pain at McBurney's point, negative Murphy sign.  Vitals are reviewed and remarkable for mildly elevated heart rate of 103.  Patient is afebrile, no hypoxia.  -Labs ordered, independently viewed and interpreted by me.  Labs remarkable for negative preg test, UA without eivence of infection, normal WBC, reassuring electrolytes -The patient was maintained on a cardiac monitor.  I personally viewed and interpreted the cardiac monitored which showed an underlying rhythm of: sinus tachycardia -Imaging independently viewed and  interpreted by me and I agree with radiologist's interpretation.  Result remarkable for acute abdominal series without evidence suggestive of SBO.  Moderate stool burden were noted -This patient presents to the ED for concern of abd pain, this involves an extensive number of treatment options, and is a complaint that carries with it a high risk of complications and morbidity.  The differential diagnosis includes constipation, hernia, appendicitis, colitis, diverticular disease, cholecystitis, pancreatitis -Co morbidities that complicate the patient evaluation includes Hx of umbilical hernia -Treatment includes IVF, morphine, zofran -Reevaluation of the patient after these medicines showed that the patient improved -PCP office notes or outside notes reviewed -Escalation to admission/observation considered: patients feels much better, is comfortable with discharge, and will follow up with PCP -Prescription medication considered, patient comfortable with zofran and tylenol -Social Determinant of Health considered  6:56 PM Workup overall reassuring.  Labs are fairly normal as well.  On reassessment patient reports any improvement of her symptoms.  As mentioned earlier, symptoms may be signs of early appendicitis and a CT scan of the abdomen pelvis could be helpful in this diagnosis.  At this time, CT scan is not available.  I gave patient the options of going home with watchful waiting and serial abdominal exam versus transfer over to [facility] for CT scan and further management.  Patient felt more comfortable going home but understand that she should return if his symptoms worsen.  I gave patient strict return precaution.  Also, patient states he she has 2 normal bowel movement today, doubt constipation causing her symptoms.         Final Clinical Impression(s) / ED Diagnoses Final diagnoses:  Periumbilical pain    Rx / DC Orders ED Discharge Orders          Ordered    acetaminophen  (TYLENOL) 500 MG tablet  Every 6 hours PRN        11/11/22 1858    ondansetron (ZOFRAN) 4 MG tablet  Every 8 hours PRN        11/11/22 1858  Domenic Moras, PA-C 11/11/22 1859    Cristie Hem, MD 11/12/22 [phone]

## 2022-11-11 NOTE — Discharge Instructions (Addendum)
You have been evaluated for your symptoms.  Fortunately today your labs are overall reassuring.  Keep in mind periumbilical abdominal pain may be early signs of appendicitis.  If you notice worsening abdominal pain or pain to the right lower quadrant of your abdomen with decrease in appetite or fever do not hesitate to go to [facility], ER for reassessment of your symptoms.  You may benefit from an abdominal pelvis CT scan at that time.  Otherwise take Zofran as needed for nausea, take Tylenol as needed for pain.

## 2022-11-11 NOTE — ED Notes (Signed)
Patient transported to X-ray 

## 2022-11-11 NOTE — ED Notes (Signed)
Patient transported to x-ray. ?

## 2022-11-11 NOTE — ED Triage Notes (Signed)
Pt here for abd pain after taking a walk. Pt was dx w/ umbilical hernia approx 6 months ago. Pt reports nausea, denies vomiting. States pain is worse w/ movement.

## 2023-03-05 DIAGNOSIS — Z419 Encounter for procedure for purposes other than remedying health state, unspecified: Secondary | ICD-10-CM | POA: Diagnosis not present

## 2023-03-28 DIAGNOSIS — O3680X0 Pregnancy with inconclusive fetal viability, not applicable or unspecified: Secondary | ICD-10-CM | POA: Diagnosis not present

## 2023-03-28 DIAGNOSIS — Z3A09 9 weeks gestation of pregnancy: Secondary | ICD-10-CM | POA: Diagnosis not present

## 2023-03-28 DIAGNOSIS — Z3481 Encounter for supervision of other normal pregnancy, first trimester: Secondary | ICD-10-CM | POA: Diagnosis not present

## 2023-04-05 DIAGNOSIS — Z419 Encounter for procedure for purposes other than remedying health state, unspecified: Secondary | ICD-10-CM | POA: Diagnosis not present

## 2023-05-06 DIAGNOSIS — Z419 Encounter for procedure for purposes other than remedying health state, unspecified: Secondary | ICD-10-CM | POA: Diagnosis not present

## 2023-06-05 DIAGNOSIS — Z419 Encounter for procedure for purposes other than remedying health state, unspecified: Secondary | ICD-10-CM | POA: Diagnosis not present

## 2023-07-06 DIAGNOSIS — Z419 Encounter for procedure for purposes other than remedying health state, unspecified: Secondary | ICD-10-CM | POA: Diagnosis not present

## 2023-07-23 DIAGNOSIS — Z719 Counseling, unspecified: Secondary | ICD-10-CM | POA: Diagnosis not present

## 2023-08-05 DIAGNOSIS — Z419 Encounter for procedure for purposes other than remedying health state, unspecified: Secondary | ICD-10-CM | POA: Diagnosis not present

## 2023-09-01 ENCOUNTER — Emergency Department (HOSPITAL_BASED_OUTPATIENT_CLINIC_OR_DEPARTMENT_OTHER)
Admission: EM | Admit: 2023-09-01 | Discharge: 2023-09-01 | Disposition: A | Discharge status: Home / Self Care | Payer: Medicaid Other | Attending: Emergency Medicine | Admitting: Emergency Medicine

## 2023-09-01 ENCOUNTER — Encounter (HOSPITAL_BASED_OUTPATIENT_CLINIC_OR_DEPARTMENT_OTHER): Payer: Self-pay

## 2023-09-01 ENCOUNTER — Other Ambulatory Visit: Payer: Self-pay

## 2023-09-01 DIAGNOSIS — Z3A31 31 weeks gestation of pregnancy: Secondary | ICD-10-CM | POA: Diagnosis not present

## 2023-09-01 DIAGNOSIS — J45909 Unspecified asthma, uncomplicated: Secondary | ICD-10-CM | POA: Insufficient documentation

## 2023-09-01 DIAGNOSIS — L299 Pruritus, unspecified: Secondary | ICD-10-CM | POA: Insufficient documentation

## 2023-09-01 DIAGNOSIS — R519 Headache, unspecified: Secondary | ICD-10-CM | POA: Diagnosis not present

## 2023-09-01 DIAGNOSIS — O2686 Pruritic urticarial papules and plaques of pregnancy (PUPPP): Secondary | ICD-10-CM | POA: Diagnosis not present

## 2023-09-01 LAB — CBC WITH DIFFERENTIAL/PLATELET
Abs Immature Granulocytes: 0.16 10*3/uL — ABNORMAL HIGH (ref 0.00–0.07)
Basophils Absolute: 0 10*3/uL (ref 0.0–0.1)
Basophils Relative: 0 %
Eosinophils Absolute: 0 10*3/uL (ref 0.0–0.5)
Eosinophils Relative: 0 %
HCT: 34.9 % — ABNORMAL LOW (ref 36.0–46.0)
Hemoglobin: 12.6 g/dL (ref 12.0–15.0)
Immature Granulocytes: 2 %
Lymphocytes Relative: 15 %
Lymphs Abs: 1.5 10*3/uL (ref 0.7–4.0)
MCH: 33.5 pg (ref 26.0–34.0)
MCHC: 36.1 g/dL — ABNORMAL HIGH (ref 30.0–36.0)
MCV: 92.8 fL (ref 80.0–100.0)
Monocytes Absolute: 0.6 10*3/uL (ref 0.1–1.0)
Monocytes Relative: 6 %
Neutro Abs: 7.6 10*3/uL (ref 1.7–7.7)
Neutrophils Relative %: 77 %
Platelets: 192 10*3/uL (ref 150–400)
RBC: 3.76 MIL/uL — ABNORMAL LOW (ref 3.87–5.11)
RDW: 13.1 % (ref 11.5–15.5)
WBC: 9.9 10*3/uL (ref 4.0–10.5)
nRBC: 0 % (ref 0.0–0.2)

## 2023-09-01 LAB — URINALYSIS, W/ REFLEX TO CULTURE (INFECTION SUSPECTED)
Bilirubin Urine: NEGATIVE
Glucose, UA: NEGATIVE mg/dL
Hgb urine dipstick: NEGATIVE
Ketones, ur: NEGATIVE mg/dL
Nitrite: NEGATIVE
Protein, ur: NEGATIVE mg/dL
Specific Gravity, Urine: 1.015 (ref 1.005–1.030)
pH: 7 (ref 5.0–8.0)

## 2023-09-01 LAB — COMPREHENSIVE METABOLIC PANEL
ALT: 14 U/L (ref 0–44)
AST: 17 U/L (ref 15–41)
Albumin: 3 g/dL — ABNORMAL LOW (ref 3.5–5.0)
Alkaline Phosphatase: 77 U/L (ref 38–126)
Anion gap: 9 (ref 5–15)
BUN: 7 mg/dL (ref 6–20)
CO2: 19 mmol/L — ABNORMAL LOW (ref 22–32)
Calcium: 8.4 mg/dL — ABNORMAL LOW (ref 8.9–10.3)
Chloride: 106 mmol/L (ref 98–111)
Creatinine, Ser: 0.4 mg/dL — ABNORMAL LOW (ref 0.44–1.00)
GFR, Estimated: >60 mL/min (ref >60)
Glucose, Bld: 92 mg/dL (ref 70–99)
Potassium: 3.7 mmol/L (ref 3.5–5.1)
Sodium: 134 mmol/L — ABNORMAL LOW (ref 135–145)
Total Bilirubin: 0.4 mg/dL (ref <1.2)
Total Protein: 6.4 g/dL — ABNORMAL LOW (ref 6.5–8.1)

## 2023-09-01 LAB — AMMONIA: Ammonia: 24 umol/L (ref 9–35)

## 2023-09-01 LAB — PROTIME-INR
INR: 0.8 (ref 0.8–1.2)
Prothrombin Time: 11.7 s (ref 11.4–15.2)

## 2023-09-01 LAB — LIPASE, BLOOD: Lipase: 26 U/L (ref 11–51)

## 2023-09-01 NOTE — ED Provider Notes (Signed)
[place] [facility] Provider Note   CSN: [id number] Arrival date & time: 09/01/23  5621     History  Chief Complaint  Patient presents with   Pruritis   Headache    ABRIELA BEFORT is a 27 y.o. female with past medical history of moderate alcohol use disorder, depression, asthma, bipolar 1 presented to emergency room with diffuse itching all of her body and headache that started last night.  Patient was sent by her OB here for blood work.  Patient is [redacted] weeks pregnant, did have a headache last night nothing today.  Patient reports blood pressure has been fine.  Not having any dysuria urinary frequency has not noticed blood in her urine.  No fevers chills chest pain shortness of breath cough no viral illness.  Patient denies any chemical exposure, new animal exposure or new detergents or soaps.  She is unsure what is causing this.  Otherwise feeling well.   Headache      Home Medications Prior to Admission medications   Medication Sig Start Date End Date Taking? Authorizing Provider  acetaminophen (TYLENOL) 500 MG tablet Take 1 tablet (500 mg total) by mouth every 6 (six) hours as needed for moderate pain or fever. 11/11/22   Fayrene Helper, PA-C  albuterol (VENTOLIN HFA) 108 (90 Base) MCG/ACT inhaler Inhale 2 puffs into the lungs every 6 (six) hours as needed. 05/29/20   [provider]  cyclobenzaprine (FLEXERIL) 10 MG tablet Take 1 tablet (10 mg total) by mouth 2 (two) times daily as needed for muscle spasms. 04/16/17   Law, Waylan Boga, PA-C  doxylamine, Sleep, (UNISOM) 25 MG tablet Take 25 mg by mouth at bedtime as needed.    [provider]  Doxylamine-Pyridoxine 10-10 MG TBEC Take 2 tablets by mouth at bedtime as needed. 10/24/20   Terrilee Files, MD  hydrOXYzine (ATARAX/VISTARIL) 10 MG tablet Take by mouth.    [provider]  ibuprofen (ADVIL,MOTRIN) 200 MG tablet Take 400 mg by mouth every 6 (six) hours as needed  for mild pain.    [provider]  ibuprofen (ADVIL,MOTRIN) 800 MG tablet Take 1 tablet (800 mg total) by mouth 3 (three) times daily. 04/16/17   Law, Waylan Boga, PA-C  lamoTRIgine (LAMICTAL) 25 MG tablet Take 50 mg by mouth daily.    [provider]  LORazepam (ATIVAN) 1 MG tablet Take 1 tablet (1 mg total) by mouth every 8 (eight) hours as needed for anxiety. 05/29/22   Tegeler, Canary Brim, MD  Melatonin 10 MG TABS Take by mouth.    [provider]  metoCLOPramide (REGLAN) 10 MG tablet Take 1 tablet (10 mg total) by mouth every 8 (eight) hours as needed for nausea or vomiting. 10/24/20   Terrilee Files, MD  ondansetron (ZOFRAN) 4 MG tablet Take 1 tablet (4 mg total) by mouth every 8 (eight) hours as needed for nausea or vomiting. 11/11/22   Fayrene Helper, PA-C  sertraline (ZOLOFT) 50 MG tablet Take 50 mg by mouth daily.    [provider]      Allergies    Hydrocodone and Codeine    Review of Systems   Review of Systems  Neurological:  Positive for headaches.    Physical Exam Updated Vital Signs BP 110/68   Pulse 95   Temp 98.3 F (36.8 C) (Oral)   Resp 18   Ht 5\' 3"  (1.6 m)   Wt 94.8 kg   LMP 10/30/2022 (  Approximate)   SpO2 95%   Breastfeeding No   BMI 37.02 kg/m  Physical Exam Vitals and nursing note reviewed.  Constitutional:      General: She is not in acute distress.    Appearance: She is not toxic-appearing.  HENT:     Head: Normocephalic and atraumatic.  Eyes:     General: No scleral icterus.    Conjunctiva/sclera: Conjunctivae normal.  Cardiovascular:     Rate and Rhythm: Normal rate and regular rhythm.     Pulses: Normal pulses.     Heart sounds: Normal heart sounds.  Pulmonary:     Effort: Pulmonary effort is normal. No respiratory distress.     Breath sounds: Normal breath sounds.  Abdominal:     General: Abdomen is flat. Bowel sounds are normal.     Palpations: Abdomen is soft.     Tenderness: There is no abdominal  tenderness.  Skin:    General: Skin is warm and dry.     Findings: No lesion.  Neurological:     General: No focal deficit present.     Mental Status: She is alert and oriented to person, place, and time. Mental status is at baseline.     ED Results / Procedures / Treatments   Labs (all labs ordered are listed, but only abnormal results are displayed) Labs Reviewed  CBC WITH DIFFERENTIAL/PLATELET - Abnormal; Notable for the following components:      Result Value   RBC 3.76 (*)    HCT 34.9 (*)    MCHC 36.1 (*)    Abs Immature Granulocytes 0.16 (*)    All other components within normal limits  AMMONIA  COMPREHENSIVE METABOLIC PANEL  LIPASE, BLOOD  PROTIME-INR  URINALYSIS, W/ REFLEX TO CULTURE (INFECTION SUSPECTED)    EKG None  Radiology No results found.  Procedures Procedures    Medications Ordered in ED Medications - No data to display  ED Course/ Medical Decision Making/ A&P                                 Medical Decision Making Amount and/or Complexity of Data Reviewed Labs: ordered.   This patient presents to the ED for concern of itching, this involves an extensive number of treatment options, and is a complaint that carries with it a high risk of complications and morbidity.  The differential diagnosis includes dermatitis, contact dermatitis, psoriasis, biliary stasis, medication side effect, allergy   Lab Tests:  I personally interpreted labs.  The pertinent results include:   CBC without leukocytosis, no anemia.  Ammonia 24, lipase 26, PT/INR normal.  CMP with sodium of 134.  Otherwise unremarkable AST, ALT, alk phos, bilirubin all within normal limits no anion gap.    Problem List / ED Course / Critical interventions / Medication management  Reporting to emergency room with 24 hours of diffuse itching.  Patient reports that her symptoms are worse over her chest.  No rash on exam no mucosal involvement no fevers chills.  Patient's not having  abdominal pain.  No jaundice or scleral icterus.  Patient hemodynamically stable well-appearing.  Labs here are very reassuring vital signs stable.  I will discharge patient home with close follow-up with OB she says she follows back up on Tuesday.  In the meantime we will try topical hydrocortisone or topical Benadryl.  Will take Benadryl as needed for itchy skin.  I have reviewed the patients home medicines  and have made adjustments as needed   Plan  F/u w/ PCP in 2-3d to ensure resolution of sx.  Patient was given return precautions. Patient stable for discharge at this time.  Patient educated on sx/dx and verbalized understanding of plan. Return to ER w/ new or worsening sx.          Final Clinical Impression(s) / ED Diagnoses Final diagnoses:  Pruritus    Rx / DC Orders ED Discharge Orders     None         Smitty Knudsen, PA-C 09/01/23 1226    Tegeler, Canary Brim, MD 09/01/23 1258

## 2023-09-01 NOTE — Discharge Instructions (Addendum)
You are seen in emergency room for itching skin.  Did not have rash, fever or concerning signs on exam.  Your lab work today is very reassuring.  You have no elevation in your liver function.  You can take Benadryl for itching.  Please follow-up with OB/GYN to ensure resolution of symptoms.  Return to emergency room if you have any changes.

## 2023-09-01 NOTE — ED Triage Notes (Signed)
The patient is [redacted] weeks pregnant. She stated last night she had very bad itching all over her body and has been having headaches. Her OB sent her for lab work.

## 2023-09-01 NOTE — ED Notes (Signed)
Pt alert and oriented X 4 at the time of discharge. RR even and unlabored. No acute distress noted. Pt verbalized understanding of discharge instructions as discussed. Pt ambulatory to lobby at time of discharge.

## 2023-09-05 DIAGNOSIS — Z419 Encounter for procedure for purposes other than remedying health state, unspecified: Secondary | ICD-10-CM | POA: Diagnosis not present

## 2023-10-06 DIAGNOSIS — Z419 Encounter for procedure for purposes other than remedying health state, unspecified: Secondary | ICD-10-CM | POA: Diagnosis not present

## 2023-11-03 DIAGNOSIS — Z419 Encounter for procedure for purposes other than remedying health state, unspecified: Secondary | ICD-10-CM | POA: Diagnosis not present

## 2023-12-15 DIAGNOSIS — Z419 Encounter for procedure for purposes other than remedying health state, unspecified: Secondary | ICD-10-CM | POA: Diagnosis not present

## 2024-01-14 DIAGNOSIS — Z419 Encounter for procedure for purposes other than remedying health state, unspecified: Secondary | ICD-10-CM | POA: Diagnosis not present

## 2024-01-18 ENCOUNTER — Ambulatory Visit
Admission: EM | Admit: 2024-01-18 | Discharge: 2024-01-18 | Disposition: A | Discharge status: Home / Self Care | Attending: Family Medicine | Admitting: Family Medicine

## 2024-01-18 ENCOUNTER — Other Ambulatory Visit: Payer: Self-pay

## 2024-01-18 ENCOUNTER — Ambulatory Visit (INDEPENDENT_AMBULATORY_CARE_PROVIDER_SITE_OTHER)

## 2024-01-18 DIAGNOSIS — J4520 Mild intermittent asthma, uncomplicated: Secondary | ICD-10-CM | POA: Diagnosis not present

## 2024-01-18 DIAGNOSIS — J019 Acute sinusitis, unspecified: Secondary | ICD-10-CM

## 2024-01-18 DIAGNOSIS — R0789 Other chest pain: Secondary | ICD-10-CM

## 2024-01-18 MED ORDER — ALBUTEROL SULFATE HFA 108 (90 BASE) MCG/ACT IN AERS: 1.0000 | INHALATION_SPRAY | Freq: Four times a day (QID) | RESPIRATORY_TRACT | 0 refills | Status: AC | PRN

## 2024-01-18 MED ORDER — AMOXICILLIN-POT CLAVULANATE 875-125 MG PO TABS: 1.0000 | ORAL_TABLET | Freq: Two times a day (BID) | ORAL | 0 refills | Status: AC

## 2024-01-18 NOTE — Discharge Instructions (Addendum)
 Start Augmentin twice daily for 7 days.  I have prescribed you an inhaler to have on hand in case you develop any wheezing or shortness of breath.  Take over-the-counter ibuprofen  or Tylenol  and use heat to the chest and back to help with your chest wall pain.  Lots of rest and fluids.  Please see your PCP in 2 days for recheck.  Please go to the ER ASAP if you develop any worsening symptoms.  This includes but is not limited to worsening chest wall pain, shortness of breath, or any new concerns that arise.  Hope you feel better soon!

## 2024-01-18 NOTE — ED Provider Notes (Addendum)
 [facility]    CSN: [id number] Arrival date & time: 01/18/24  0102      History   Chief Complaint No chief complaint on file.   HPI Deborah Morgan is a 28 y.o. female presents for chest tightness.  Patient reports over the past week she has had some sinus pressure/pain/congestion that seems to have been improving.  She reports over the past 2 days she has had an intermittent chest tightness that is worse when she lays down.  Also endorses palpitations when she lays down.  Does states she is not drinking as much water as she typically does.  She denies any fevers, chills, cough, sore throat, ear pain, body aches, shortness of breath.  Does states she has asthma but has not used her inhaler in years.  No smoking history.  She is 3 months postpartum.  Denies any lower extremity swelling, orthopnea.  Denies any complications during pregnancy or delivery.  She has been taking Mucinex .  She is not breast-feeding.  No other concerns at this time.  HPI  Past Medical History:  Diagnosis Date   ADHD    Asthma    Depression     Patient Active Problem List   Diagnosis Date Noted   Bipolar I disorder, current or most recent episode hypomanic (HCC) 11/18/2017   Moderate alcohol use disorder (HCC) 11/18/2017    Past Surgical History:  Procedure Laterality Date   WISDOM TOOTH EXTRACTION      OB History     Gravida  3   Para  0   Term  0   Preterm  0   AB  0   Living         SAB  0   IAB  0   Ectopic  0   Multiple      Live Births               Home Medications    Prior to Admission medications   Medication Sig Start Date End Date Taking? Authorizing Provider  albuterol (VENTOLIN HFA) 108 (90 Base) MCG/ACT inhaler Inhale 1-2 puffs into the lungs every 6 (six) hours as needed. 01/18/24  Yes Chinwe Lope, Jodi R, NP  amoxicillin-clavulanate (AUGMENTIN) 875-125 MG tablet Take 1 tablet by mouth every 12 (twelve) hours. 01/18/24  Yes Jamyron Redd, Jodi R, NP  busPIRone  (BUSPAR) 15 MG tablet Take 15 mg by mouth. 12/18/23  Yes [provider]  acetaminophen  (TYLENOL ) 500 MG tablet Take 1 tablet (500 mg total) by mouth every 6 (six) hours as needed for moderate pain or fever. 11/11/22   Debbra Fairy, PA-C  cyclobenzaprine  (FLEXERIL ) 10 MG tablet Take 1 tablet (10 mg total) by mouth 2 (two) times daily as needed for muscle spasms. 04/16/17   Law, Darnell Elbe, PA-C  doxylamine , Sleep, (UNISOM ) 25 MG tablet Take 25 mg by mouth at bedtime as needed.    [provider]  Doxylamine -Pyridoxine  10-10 MG TBEC Take 2 tablets by mouth at bedtime as needed. 10/24/20   Tonya Fredrickson, MD  hydrOXYzine  (ATARAX /VISTARIL ) 10 MG tablet Take by mouth.    [provider]  ibuprofen  (ADVIL ,MOTRIN ) 200 MG tablet Take 400 mg by mouth every 6 (six) hours as needed for mild pain.    [provider]  ibuprofen  (ADVIL ,MOTRIN ) 800 MG tablet Take 1 tablet (800 mg total) by mouth 3 (three) times daily. 04/16/17   Law, Alexandra M, PA-C  lamoTRIgine (LAMICTAL) 25 MG tablet Take 50 mg by  mouth daily.    [provider]  LORazepam  (ATIVAN ) 1 MG tablet Take 1 tablet (1 mg total) by mouth every 8 (eight) hours as needed for anxiety. 05/29/22   Tegeler, Marine Sia, MD  Melatonin 10 MG TABS Take by mouth.    [provider]  metoCLOPramide  (REGLAN ) 10 MG tablet Take 1 tablet (10 mg total) by mouth every 8 (eight) hours as needed for nausea or vomiting. 10/24/20   Tonya Fredrickson, MD  ondansetron  (ZOFRAN ) 4 MG tablet Take 1 tablet (4 mg total) by mouth every 8 (eight) hours as needed for nausea or vomiting. 11/11/22   Debbra Fairy, PA-C  sertraline (ZOLOFT) 50 MG tablet Take 50 mg by mouth daily.    [provider]    Family History History reviewed. No pertinent family history.  Social History Social History   Tobacco Use   Smoking status: Former    Current packs/day: 0.50    Average packs/day: 0.5 packs/day for 5.0 years (2.5 ttl  pk-yrs)    Types: Cigarettes   Smokeless tobacco: Never  Vaping Use   Vaping status: Former  Substance Use Topics   Alcohol use: Not Currently   Drug use: Not Currently    Types: Marijuana     Allergies   Hydrocodone and Codeine   Review of Systems Review of Systems  HENT:  Positive for congestion and sinus pressure. Negative for sore throat.   Respiratory:  Positive for chest tightness. Negative for shortness of breath.      Physical Exam Triage Vital Signs ED Triage Vitals  Encounter Vitals Group     BP 01/18/24 1012 99/69     Systolic BP Percentile --      Diastolic BP Percentile --      Pulse Rate 01/18/24 1012 75     Resp 01/18/24 1012 16     Temp 01/18/24 1012 98.1 F (36.7 C)     Temp Source 01/18/24 1012 Oral     SpO2 01/18/24 1012 97 %     Weight --      Height --      Head Circumference --      Peak Flow --      Pain Score 01/18/24 1009 3     Pain Loc --      Pain Education --      Exclude from Growth Chart --    No data found.  Updated Vital Signs BP 99/69   Pulse 75   Temp 98.1 F (36.7 C) (Oral)   Resp 16   LMP 12/27/2023   SpO2 97%   Breastfeeding No   Visual Acuity Right Eye Distance:   Left Eye Distance:   Bilateral Distance:    Right Eye Near:   Left Eye Near:    Bilateral Near:     Physical Exam Vitals and nursing note reviewed.  Constitutional:      General: She is not in acute distress.    Appearance: She is well-developed. She is not ill-appearing.  HENT:     Head: Normocephalic and atraumatic.     Right Ear: Tympanic membrane and ear canal normal.     Left Ear: Tympanic membrane and ear canal normal.     Nose: Congestion present.     Right Sinus: No maxillary sinus tenderness or frontal sinus tenderness.     Left Sinus: No maxillary sinus tenderness or frontal sinus tenderness.     Mouth/Throat:     Mouth: Mucous membranes  are moist.     Pharynx: Oropharynx is clear. Uvula midline. No posterior oropharyngeal  erythema.     Tonsils: No tonsillar exudate or tonsillar abscesses.  Eyes:     Conjunctiva/sclera: Conjunctivae normal.     Pupils: Pupils are equal, round, and reactive to light.  Cardiovascular:     Rate and Rhythm: Normal rate and regular rhythm.     Heart sounds: Normal heart sounds.  Pulmonary:     Effort: Pulmonary effort is normal.     Breath sounds: Normal breath sounds. No wheezing, rhonchi or rales.  Chest:     Chest wall: Tenderness present.    Musculoskeletal:       Arms:     Cervical back: Normal range of motion and neck supple.     Right lower leg: No edema.     Left lower leg: No edema.     Comments: Tender to palpation along the upper mid back without spinal tenderness  Lymphadenopathy:     Cervical: No cervical adenopathy.  Skin:    General: Skin is warm and dry.  Neurological:     General: No focal deficit present.     Mental Status: She is alert and oriented to person, place, and time.  Psychiatric:        Mood and Affect: Mood normal.        Behavior: Behavior normal.    PERC Criteria for low probability for Pulmonary Embolism  Age <50 years  Heart rate <100 beats/minute  Oxyhemoglobin saturation >=95 percent  No hemoptysis  No estrogen use  No prior DVT or PE  No unilateral leg swelling  No surgery/trauma requiring hospitalization within the prior four weeks  Total score: 0    UC Treatments / Results  Labs (all labs ordered are listed, but only abnormal results are displayed) Labs Reviewed - No data to display  Comprehensive metabolic panel Order: [id number]  Status: Final result     Next appt: None   Test Result Released: No (inaccessible in MyChart)   0 Result Notes         Component Ref Range & Units (hover) 4 mo ago (09/01/23) 1 yr ago (11/11/22) 1 yr ago (05/29/22) 2 yr ago (03/16/21) 3 yr ago (10/24/20) 6 yr ago (11/18/17)  Sodium 134 Low  135 139 133 Low  134 Low  142  Potassium 3.7 3.7 3.9 3.7 3.1 Low  3.3 Low    Chloride 106 103 107 100 102 108 R  CO2 19 Low  26 24 24 21  Low  23  Glucose, Bld 92 110 High  CM 119 High  CM 104 High  CM 94 CM 96 R  Comment: Glucose reference range applies only to samples taken after fasting for at least 8 hours.  BUN 7 15 11 7 10 7   Creatinine, Ser 0.40 Low  0.80 0.79 0.46 0.54 0.54  Calcium 8.4 Low  8.8 Low  9.3 8.9 9.4 8.8 Low   Total Protein 6.4 Low  7.8 7.4 7.0 7.8 7.3  Albumin 3.0 Low  4.2 4.2 3.3 Low  4.6 4.5  AST 17 21 17 22 18 26   ALT 14 17 12 21 13 18  R  Alkaline Phosphatase 77 62 58 68 45 57  Total Bilirubin 0.4 0.7 R 1.1 R 0.2 Low  R 1.8 High  R 0.7 R  GFR, Estimated >60 >60 CM >60 CM >60 CM >60 CM   Comment: (NOTE) Calculated using the CKD-EPI Creatinine Equation (2021)  Anion gap 9 6 CM 8 CM 9 CM 11 CM 11 CM  Comment: Performed at [facility], [address] [place], [place] [address]  Resulting Agency [facility] CL[facility] LAB        Specimen Collected: 09/01/23 10:00 Last Resulted: 09/01/23 11:33       EKG   Radiology DG Chest 2 View Result Date: 01/18/2024 CLINICAL DATA:  Chest tightness for 2 days. EXAM: CHEST - 2 VIEW COMPARISON:  11/11/2022. FINDINGS: Bilateral lung fields are clear. Bilateral costophrenic angles are clear. Normal cardio-mediastinal silhouette. No acute osseous abnormalities. The soft tissues are within normal limits. IMPRESSION: No active cardiopulmonary disease. Electronically Signed   By: Beula Brunswick M.D.   On: 01/18/2024 10:44    Procedures ED EKG  Date/Time: 01/18/2024 10:46 AM  Performed by: Alleen Arbour, NP Authorized by: Alleen Arbour, NP   ECG interpreted by ED Physician in the absence of a cardiologist: no   Previous ECG:    Previous ECG:  Compared to current Interpretation:    Interpretation: normal   Rate:    ECG rate:  64   ECG rate assessment: normal   Rhythm:    Rhythm: sinus rhythm   Ectopy:    Ectopy: none   QRS:    QRS  axis:  Normal ST segments:    ST segments:  Normal T waves:    T waves: normal    (including critical care time)  Medications Ordered in UC Medications - No data to display  Initial Impression / Assessment and Plan / UC Course  I have reviewed the triage vital signs and the nursing notes.  Pertinent labs & imaging results that were available during my care of the patient were reviewed by me and considered in my medical decision making (see chart for details).     Patient declined nebulizer stating it makes her nauseous and throw up.  Reviewed exam and symptoms with patient.  Chest x-ray negative for pneumonia.  EKG without any acute ST-T wave changes.  Patient with musculoskeletal tenderness to chest and upper back.  Discussed chest tightness symptoms could be very well musculoskeletal as she is carrying an infant around constantly for the past 3 months and exam does have muscular tenderness to chest and back.  She has no orthopnea, lower extremity swelling, or shortness of breath.  PERC score is 0.  will do Augmentin to treat sinusitis and will give her an inhaler to have on hand in case she has any asthma symptoms.  Discussed ibuprofen  Tylenol  and heat to the area.  Advise she follow-up with her PCP in 2 days for recheck.  Strict ER precautions reviewed and patient verbalized understanding. Final Clinical Impressions(s) / UC Diagnoses   Final diagnoses:  Chest tightness  Acute sinusitis, recurrence not specified, unspecified location  Musculoskeletal chest pain  Mild intermittent asthma without complication     Discharge Instructions      Start Augmentin twice daily for 7 days.  I have prescribed you an inhaler to have on hand in case you develop any wheezing or shortness of breath.  Take over-the-counter ibuprofen  or Tylenol  and use heat to the chest and back to help with your chest wall pain.  Lots of rest and fluids.  Please see your PCP in 2 days for recheck.  Please go to the ER  ASAP if you develop any worsening  symptoms.  This includes but is not limited to worsening chest wall pain, shortness of breath, or any new concerns that arise.  Hope you feel better soon!   ED Prescriptions     Medication Sig Dispense Auth. Provider   amoxicillin-clavulanate (AUGMENTIN) 875-125 MG tablet Take 1 tablet by mouth every 12 (twelve) hours. 14 tablet Herman Mell, Jodi R, NP   albuterol (VENTOLIN HFA) 108 (90 Base) MCG/ACT inhaler Inhale 1-2 puffs into the lungs every 6 (six) hours as needed. 1 each Alleen Arbour, NP      PDMP not reviewed this encounter.   Alleen Arbour, NP 01/18/24 1110    Alleen Arbour, NP 01/18/24 [phone]

## 2024-01-18 NOTE — ED Triage Notes (Signed)
 Pt c/o tightness across entire chestx2d. Pt states c/o sinus pressure, sinus congestion, sinus drainagex1wk. Pt states was taken mucinex  and it moved to throat and not chest tightness. Pt c/o nausea this morning. Pt denies vomiting or SOB. Pt states had migraine yesterday

## 2024-02-14 DIAGNOSIS — Z419 Encounter for procedure for purposes other than remedying health state, unspecified: Secondary | ICD-10-CM | POA: Diagnosis not present

## 2024-03-15 DIAGNOSIS — Z419 Encounter for procedure for purposes other than remedying health state, unspecified: Secondary | ICD-10-CM | POA: Diagnosis not present

## 2024-04-15 DIAGNOSIS — Z419 Encounter for procedure for purposes other than remedying health state, unspecified: Secondary | ICD-10-CM | POA: Diagnosis not present

## 2024-05-16 DIAGNOSIS — Z419 Encounter for procedure for purposes other than remedying health state, unspecified: Secondary | ICD-10-CM | POA: Diagnosis not present

## 2024-06-12 ENCOUNTER — Ambulatory Visit: Admitting: Physical Therapy

## 2024-06-23 ENCOUNTER — Ambulatory Visit: Admitting: Physical Therapy

## 2024-06-30 ENCOUNTER — Ambulatory Visit: Admitting: Physical Therapy

## 2024-08-27 ENCOUNTER — Encounter (HOSPITAL_BASED_OUTPATIENT_CLINIC_OR_DEPARTMENT_OTHER): Payer: Self-pay | Admitting: Emergency Medicine

## 2024-08-27 ENCOUNTER — Emergency Department (HOSPITAL_BASED_OUTPATIENT_CLINIC_OR_DEPARTMENT_OTHER)
Admission: EM | Admit: 2024-08-27 | Discharge: 2024-08-27 | Disposition: A | Discharge status: Home / Self Care | Attending: Emergency Medicine | Admitting: Emergency Medicine

## 2024-08-27 ENCOUNTER — Other Ambulatory Visit: Payer: Self-pay

## 2024-08-27 DIAGNOSIS — R197 Diarrhea, unspecified: Secondary | ICD-10-CM | POA: Diagnosis present

## 2024-08-27 DIAGNOSIS — J45909 Unspecified asthma, uncomplicated: Secondary | ICD-10-CM | POA: Diagnosis not present

## 2024-08-27 LAB — COMPREHENSIVE METABOLIC PANEL WITH GFR
ALT: 19 U/L (ref 0–44)
AST: 17 U/L (ref 15–41)
Albumin: 4.5 g/dL (ref 3.5–5.0)
Alkaline Phosphatase: 74 U/L (ref 38–126)
Anion gap: 12 (ref 5–15)
BUN: 8 mg/dL (ref 6–20)
CO2: 25 mmol/L (ref 22–32)
Calcium: 9.2 mg/dL (ref 8.9–10.3)
Chloride: 103 mmol/L (ref 98–111)
Creatinine, Ser: 0.76 mg/dL (ref 0.44–1.00)
GFR, Estimated: >60 mL/min
Glucose, Bld: 93 mg/dL (ref 70–99)
Potassium: 4 mmol/L (ref 3.5–5.1)
Sodium: 139 mmol/L (ref 135–145)
Total Bilirubin: 0.8 mg/dL (ref 0.0–1.2)
Total Protein: 7.4 g/dL (ref 6.5–8.1)

## 2024-08-27 LAB — URINALYSIS, ROUTINE W REFLEX MICROSCOPIC
Bilirubin Urine: NEGATIVE
Glucose, UA: NEGATIVE mg/dL
Ketones, ur: NEGATIVE mg/dL
Nitrite: NEGATIVE
Protein, ur: NEGATIVE mg/dL
Specific Gravity, Urine: 1.01 (ref 1.005–1.030)
pH: 6 (ref 5.0–8.0)

## 2024-08-27 LAB — CBC WITH DIFFERENTIAL/PLATELET
Abs Immature Granulocytes: 0.01 K/uL (ref 0.00–0.07)
Basophils Absolute: 0 K/uL (ref 0.0–0.1)
Basophils Relative: 1 %
Eosinophils Absolute: 0.2 K/uL (ref 0.0–0.5)
Eosinophils Relative: 3 %
HCT: 44 % (ref 36.0–46.0)
Hemoglobin: 15.8 g/dL — ABNORMAL HIGH (ref 12.0–15.0)
Immature Granulocytes: 0 %
Lymphocytes Relative: 27 %
Lymphs Abs: 1.6 K/uL (ref 0.7–4.0)
MCH: 31.2 pg (ref 26.0–34.0)
MCHC: 35.9 g/dL (ref 30.0–36.0)
MCV: 87 fL (ref 80.0–100.0)
Monocytes Absolute: 0.5 K/uL (ref 0.1–1.0)
Monocytes Relative: 8 %
Neutro Abs: 3.7 K/uL (ref 1.7–7.7)
Neutrophils Relative %: 61 %
Platelets: 228 K/uL (ref 150–400)
RBC: 5.06 MIL/uL (ref 3.87–5.11)
RDW: 12 % (ref 11.5–15.5)
WBC: 5.9 K/uL (ref 4.0–10.5)
nRBC: 0 % (ref 0.0–0.2)

## 2024-08-27 LAB — URINALYSIS, MICROSCOPIC (REFLEX)

## 2024-08-27 LAB — PREGNANCY, URINE: Preg Test, Ur: NEGATIVE

## 2024-08-27 MED ORDER — SODIUM CHLORIDE 0.9 % IV BOLUS
1000.0000 mL | Freq: Once | INTRAVENOUS | Status: AC
  Administered 2024-08-27: 1000 mL via INTRAVENOUS

## 2024-08-27 NOTE — ED Triage Notes (Signed)
 Pt takes wygovy, endorses diarrhea x 3 days, concern for dehydration. Denies n/v

## 2024-08-27 NOTE — ED Provider Notes (Signed)
 " [facility] Provider Note   CSN: [id number] Arrival date & time: 08/27/24  0800     Patient presents with: Diarrhea   Deborah Morgan is a 28 y.o. female.   Patient here with diarrhea for 3 days she is concerned for dehydration but she has had no nausea or vomiting.  She takes [employer].  Denies any fever or chills.  No abdominal pain.  History of depression and asthma.  No chest pain shortness of breath weakness numbness tingling otherwise.  No pain with urination.  The history is provided by the patient.       Prior to Admission medications  Medication Sig Start Date End Date Taking? Authorizing Provider  acetaminophen  (TYLENOL ) 500 MG tablet Take 1 tablet (500 mg total) by mouth every 6 (six) hours as needed for moderate pain or fever. 11/11/22   Nivia Colon, PA-C  albuterol  (VENTOLIN  HFA) 108 (90 Base) MCG/ACT inhaler Inhale 1-2 puffs into the lungs every 6 (six) hours as needed. 01/18/24   Mayer, Jodi R, NP  amoxicillin -clavulanate (AUGMENTIN ) 875-125 MG tablet Take 1 tablet by mouth every 12 (twelve) hours. 01/18/24   Mayer, Jodi R, NP  busPIRone (BUSPAR) 15 MG tablet Take 15 mg by mouth. 12/18/23   [provider]  cyclobenzaprine  (FLEXERIL ) 10 MG tablet Take 1 tablet (10 mg total) by mouth 2 (two) times daily as needed for muscle spasms. 04/16/17   Law, Lorane HERO, PA-C  doxylamine , Sleep, (UNISOM ) 25 MG tablet Take 25 mg by mouth at bedtime as needed.    [provider]  Doxylamine -Pyridoxine  10-10 MG TBEC Take 2 tablets by mouth at bedtime as needed. 10/24/20   Towana Ozell BROCKS, MD  hydrOXYzine  (ATARAX /VISTARIL ) 10 MG tablet Take by mouth.    [provider]  ibuprofen  (ADVIL ,MOTRIN ) 200 MG tablet Take 400 mg by mouth every 6 (six) hours as needed for mild pain.    [provider]  ibuprofen  (ADVIL ,MOTRIN ) 800 MG tablet Take 1 tablet (800 mg total) by mouth 3 (three) times daily. 04/16/17   Law,  Alexandra M, PA-C  lamoTRIgine (LAMICTAL) 25 MG tablet Take 50 mg by mouth daily.    [provider]  LORazepam  (ATIVAN ) 1 MG tablet Take 1 tablet (1 mg total) by mouth every 8 (eight) hours as needed for anxiety. 05/29/22   Tegeler, Lonni PARAS, MD  Melatonin 10 MG TABS Take by mouth.    [provider]  metoCLOPramide  (REGLAN ) 10 MG tablet Take 1 tablet (10 mg total) by mouth every 8 (eight) hours as needed for nausea or vomiting. 10/24/20   Towana Ozell BROCKS, MD  ondansetron  (ZOFRAN ) 4 MG tablet Take 1 tablet (4 mg total) by mouth every 8 (eight) hours as needed for nausea or vomiting. 11/11/22   Nivia Colon, PA-C  sertraline (ZOLOFT) 50 MG tablet Take 50 mg by mouth daily.    [provider]    Allergies: Hydrocodone and Codeine    Review of Systems  Updated Vital Signs BP 109/81   Pulse 100   Temp 98.2 F (36.8 C) (Oral)   Resp 18   Wt 83.9 kg   LMP 08/24/2024   SpO2 97%   BMI 32.77 kg/m   Physical Exam Vitals and nursing note reviewed.  Constitutional:      General: She is not in acute distress.    Appearance: She is well-developed. She is not ill-appearing.  HENT:     Head: Normocephalic and  atraumatic.     Nose: Nose normal.     Mouth/Throat:     Mouth: Mucous membranes are moist.  Eyes:     Extraocular Movements: Extraocular movements intact.     Conjunctiva/sclera: Conjunctivae normal.     Pupils: Pupils are equal, round, and reactive to light.  Cardiovascular:     Rate and Rhythm: Normal rate and regular rhythm.     Pulses: Normal pulses.     Heart sounds: Normal heart sounds. No murmur heard. Pulmonary:     Effort: Pulmonary effort is normal. No respiratory distress.     Breath sounds: Normal breath sounds.  Abdominal:     General: Abdomen is flat.     Palpations: Abdomen is soft.     Tenderness: There is no abdominal tenderness.  Musculoskeletal:        General: No swelling.     Cervical back: Normal range of motion and neck  supple.  Skin:    General: Skin is warm and dry.     Capillary Refill: Capillary refill takes less than 2 seconds.  Neurological:     Mental Status: She is alert.  Psychiatric:        Mood and Affect: Mood normal.     (all labs ordered are listed, but only abnormal results are displayed) Labs Reviewed  CBC WITH DIFFERENTIAL/PLATELET - Abnormal; Notable for the following components:      Result Value   Hemoglobin 15.8 (*)    All other components within normal limits  URINALYSIS, ROUTINE W REFLEX MICROSCOPIC - Abnormal; Notable for the following components:   Hgb urine dipstick LARGE (*)    Leukocytes,Ua SMALL (*)    All other components within normal limits  URINALYSIS, MICROSCOPIC (REFLEX) - Abnormal; Notable for the following components:   Bacteria, UA FEW (*)    All other components within normal limits  COMPREHENSIVE METABOLIC PANEL WITH GFR  PREGNANCY, URINE    EKG: None  Radiology: No results found.   Procedures   Medications Ordered in the ED  sodium chloride  0.9 % bolus 1,000 mL (1,000 mLs Intravenous New Bag/Given 08/27/24 0841)                                    Medical Decision Making Amount and/or Complexity of Data Reviewed Labs: ordered.   Deborah Morgan is here with diarrhea and concern for dehydration.  Normal vitals.  No fever.  Well-appearing.  Differential diagnosis likely viral process foodborne illness may be medication side effect.  She looks very well.  Has not had any nausea or vomiting.  Will check labs to make sure electrolytes and kidney function are okay will give fluid bolus.  Not having any urinary symptoms.  Lab work is unremarkable.  No significant leukocytosis anemia or electrolyte abnormality.  No kidney injury.  Pregnancy test negative.  Not having any urinary symptoms and overall suspect urinalysis not a great catch.  I do suspect that this could be viral process or foodborne illness.  Could be a medication side effect.  If having  any persistent symptoms she should follow-up with primary care return if symptoms worsen.  I recommend Imodium.  Discharge.  And no concern for C. difficile.  This chart was dictated using voice recognition software.  Despite best efforts to proofread,  errors can occur which can change the documentation meaning.      Final diagnoses:  Diarrhea,  unspecified type    ED Discharge Orders     None          Ruthe Cornet, DO 08/27/24 9070  "

## 2024-08-27 NOTE — Discharge Instructions (Signed)
 Recommend buying Imodium over-the-counter to help with diarrhea.  Return if symptoms worsen.  Follow-up with your primary care doctor as well.  Hopefully this will self resolved.
